# Patient Record
Sex: Female | Born: 1970
Health system: Southern US, Community
[De-identification: ages and names within clinical notes are randomized; demographics above are authoritative.]

## PROBLEM LIST (undated history)

## (undated) DIAGNOSIS — D259 Leiomyoma of uterus, unspecified: Secondary | ICD-10-CM

## (undated) DIAGNOSIS — I10 Essential (primary) hypertension: Secondary | ICD-10-CM

---

## 2000-12-11 ENCOUNTER — Encounter: Payer: Self-pay | Admitting: Family Medicine

## 2000-12-11 ENCOUNTER — Ambulatory Visit (HOSPITAL_COMMUNITY): Admission: RE | Admit: 2000-12-11 | Discharge: 2000-12-11 | Payer: Self-pay | Admitting: Family Medicine

## 2001-03-22 ENCOUNTER — Emergency Department (HOSPITAL_COMMUNITY): Admission: EM | Admit: 2001-03-22 | Discharge: 2001-03-22 | Payer: Self-pay | Admitting: Emergency Medicine

## 2001-04-07 ENCOUNTER — Encounter (HOSPITAL_COMMUNITY): Admission: RE | Admit: 2001-04-07 | Discharge: 2001-05-07 | Payer: Self-pay | Admitting: Pulmonary Disease

## 2001-06-04 ENCOUNTER — Emergency Department (HOSPITAL_COMMUNITY): Admission: EM | Admit: 2001-06-04 | Discharge: 2001-06-04 | Payer: Self-pay | Admitting: Emergency Medicine

## 2002-04-19 ENCOUNTER — Encounter: Payer: Self-pay | Admitting: Obstetrics and Gynecology

## 2002-04-19 ENCOUNTER — Ambulatory Visit (HOSPITAL_COMMUNITY): Admission: RE | Admit: 2002-04-19 | Discharge: 2002-04-19 | Payer: Self-pay | Admitting: Obstetrics and Gynecology

## 2002-05-14 ENCOUNTER — Observation Stay (HOSPITAL_COMMUNITY): Admission: AD | Admit: 2002-05-14 | Discharge: 2002-05-15 | Payer: Self-pay | Admitting: Obstetrics and Gynecology

## 2002-05-19 ENCOUNTER — Inpatient Hospital Stay (HOSPITAL_COMMUNITY): Admission: AD | Admit: 2002-05-19 | Discharge: 2002-05-22 | Payer: Self-pay | Admitting: Obstetrics and Gynecology

## 2002-07-04 ENCOUNTER — Ambulatory Visit (HOSPITAL_COMMUNITY): Admission: RE | Admit: 2002-07-04 | Discharge: 2002-07-04 | Payer: Self-pay | Admitting: Obstetrics and Gynecology

## 2002-07-04 ENCOUNTER — Encounter: Payer: Self-pay | Admitting: Obstetrics and Gynecology

## 2002-09-30 ENCOUNTER — Ambulatory Visit (HOSPITAL_COMMUNITY): Admission: RE | Admit: 2002-09-30 | Discharge: 2002-09-30 | Payer: Self-pay | Admitting: Family Medicine

## 2002-09-30 ENCOUNTER — Encounter: Payer: Self-pay | Admitting: Family Medicine

## 2002-12-22 ENCOUNTER — Other Ambulatory Visit: Admission: RE | Admit: 2002-12-22 | Discharge: 2002-12-22 | Payer: Self-pay | Admitting: Unknown Physician Specialty

## 2006-05-23 ENCOUNTER — Emergency Department (HOSPITAL_COMMUNITY): Admission: EM | Admit: 2006-05-23 | Discharge: 2006-05-23 | Payer: Self-pay | Admitting: Emergency Medicine

## 2007-09-22 ENCOUNTER — Other Ambulatory Visit: Admission: RE | Admit: 2007-09-22 | Discharge: 2007-09-22 | Payer: Self-pay | Admitting: Obstetrics and Gynecology

## 2008-05-18 ENCOUNTER — Ambulatory Visit (HOSPITAL_COMMUNITY): Admission: RE | Admit: 2008-05-18 | Discharge: 2008-05-18 | Payer: Self-pay | Admitting: Internal Medicine

## 2010-06-11 ENCOUNTER — Emergency Department (HOSPITAL_COMMUNITY)
Admission: EM | Admit: 2010-06-11 | Discharge: 2010-06-11 | Payer: Self-pay | Source: Home / Self Care | Admitting: Emergency Medicine

## 2010-11-15 NOTE — Op Note (Signed)
   NAME:  KIORA, HALLBERG                         ACCOUNT NO.:  [id number]   MEDICAL RECORD NO.:  [id number]                   PATIENT TYPE:  INP   LOCATION:  A428                                 FACILITY:  [facility]   PHYSICIAN:  Lazaro Arms, M.D.                DATE OF BIRTH:  [birth year]   DATE OF PROCEDURE:  05/20/2002  DATE OF DISCHARGE:                                 OPERATIVE REPORT   EPIDURAL NOTE:  This patient is a multigravida who is 4-5 cm and is having  regular uterine contractions; had IV pain medicine; requesting an epidural.   The patient was placed in the sitting position.  The iliac crest and the  spinous processes were identified, L2-L3 and L3-L4 interspaces were  identified.  The back was prepped and field draped.  Lidocaine 1% was  injected as a skin wheal. A 17-gauge Touhy needle was used and a loss of  resistance technique employed to find the epidural space.  It required 2  skin punctures.   Ten cc of 0.125% bupivacaine plain was given as a test dose with no adverse  effects.  An additional 10 cc was given again without effect.  The 9 cc  bolus of the continuous mixture was given followed by 12 cc an hour.  The  patient tolerated the procedure well and she experienced no adverse  reactions.  It was placed at the L2-L3 interspace.                                                Lazaro Arms, M.D.    Loraine Maple  D:  05/20/2002  T:  05/21/2002  Job:  [id number]

## 2010-11-15 NOTE — Discharge Summary (Signed)
NAME:  Elizabeth Harper, Elizabeth Harper                         ACCOUNT NO.:  [id number]   MEDICAL RECORD NO.:  [id number]                   PATIENT TYPE:  OBV   LOCATION:  A415                                 FACILITY:  [facility]   PHYSICIAN:  Langley Gauss, M.D.                DATE OF BIRTH:  1971-05-13   DATE OF ADMISSION:  05/14/2002  DATE OF DISCHARGE:  05/15/2002                                 DISCHARGE SUMMARY   DIAGNOSES:  1. Thirty-seven-week intrauterine pregnancy.  2. Threatened preterm labor.  3. Polyhydramnios.   PROCEDURES:  1. Limited OB ultrasound performed by Dr. Roylene Reason. Lisette Grinder.  2. Non-stress test.   Interpretation:  Both of these performed on May 14, 2002.   HOSPITAL COURSE:  This is a 40 year old gravida 4, para 3, at 37-1/[redacted] weeks  gestation who presents to [facility] with the chief complaint of  abdominal pain.  The patient's prenatal course has been complicated by the  following findings of polyhydramnios.  The patient states she had an  ultrasound done in the middle of October which revealed increased amniotic  fluid volume.  The infant was noted to be vertex at that time.  Subsequently, the patient has been seen in the office of [facility]  at which time she was planned to have an induction of labor at [redacted] weeks  gestation.  She is experiencing significant abdominal discomfort and some  shortness of breath with minimal exertion felt to be due to the  polyhydramnios.   OBSTETRICAL HISTORY:  She has three prior vaginal deliveries without  complications.  Most pertinently, normal glucose tolerance test on each of  these.  There is no evidence of gestational diabetes.   ALLERGIES:  No known drug allergies.   PHYSICAL EXAMINATION:  GENERAL:  Obese black female.  VITAL SIGNS:  Height 5 feet 5 inches, weight 245 pounds.  Blood pressure  114/75, pulse rate of 111, temperature 97.4, respiratory rate is 20.  HEENT:  Negative.  No adenopathy.  NECK:   Supple.  Thyroid is nonpalpable.  LUNGS:  Clear.  CARDIOVASCULAR:  Sinus, regular rate and rhythm.  ABDOMEN:  Soft and nontender.  Fundal height is noted at 43 cm.  She appears  to be vertex presentation by [employer].  No surgical scars are  identified.  PELVIC:  Normal external genitalia.  No lesions or ulcerations identified.  Vaginal examination by the nursing staff reveals the cervix to be 1-2 cm  dilated, presenting part is not engaged and not palpable.  External fetal  monitoring reveals fetal heart rate baseline of 140s, contractions occurring  every three to six minutes.  A reassuring fetal heart rate is noted.  Initial assessment by the nursing staff revealed apparent significant  discomfort associated with these uterine contractions felt to be out of  proportion to the strength of the contractions.  Likewise, presentation was  uncertain.  Thus,  M.D. consultation was requested.   HOSPITAL COURSE:  The patient was evaluated on May 14, 2002, at which  time the limited OB ultrasound was performed.  The infant is noted to be  vertex presentation.  The vertex is not engaged in the pelvis, though.  Amniotic fluid index is performed with a -5/20.  The placenta is noted to be  fundal and primarily posterior.  Good fetal movement is identified.  Normal  fetal tone.  Mild fetal cardiac activity is identified as well as fetal  breathing movements.  Non-stress test is noted to be reactive.  The uterine  contractions at the time of my evaluation have diminished in their frequency  and intensity, such that they are every three to seven minutes and of mild  intensity.  Laboratory studies requested reveal many bacteria, however, many  urine epithelial cells likewise identified, 11-20 white cells.  Specific  gravity is greater than 1.030, with ketones of greater than 80.  White count  is 8.7, hemoglobin 10.6, hematocrit 33.6.   HOSPITAL COURSE:  The patient following this  observation period was  evaluated with serial digital examinations of the cervix.  Likewise,  external fetal monitor was watched carefully for possible onset of labor.  The patient, however, had variable complaints of contractions, the  contractions occurring every three to 10 minutes, however, never increasing  their intensity.  The patient was treated with p.o. Tylox for therapeutic  rest as well as p.o. Ambien.  These were administered primarily on May 14, 2002, after which time the patient was noted to sleep well during the  evening.  Thus, on May 15, 2002, it is determined that she is not in  active labor.  Uterus is soft and nontender.  The patient has had no  documented cervical change.  Thus, she is discharged home on May 15, 2002, with the findings of not in labor.  Complete evaluation has now  revealed a reactive non-stress test.  A biophysical profile is 10/10, and a  limited OB ultrasound confirms vertex presentation.   DISCHARGE MEDICATIONS:  Tylox #30 for abdominal discomfort associated with  pregnancy and uterine contractions.   DISPOSITION:  The patient is to be seen in the office of [facility]  this week for continued discussion of plans for induction at the end of this  week.                                                Langley Gauss, M.D.    DC/MEDQ  D:  05/15/2002  T:  05/15/2002  Job:  [id number]   cc:   [facility]

## 2010-11-15 NOTE — Op Note (Signed)
   NAME:  Elizabeth Harper, Elizabeth Harper                         ACCOUNT NO.:  [id number]   MEDICAL RECORD NO.:  [id number]                   PATIENT TYPE:  INP   LOCATION:  A428                                 FACILITY:  [facility]   PHYSICIAN:  Tilda Burrow, M.D.              DATE OF BIRTH:  12/01/1970   DATE OF PROCEDURE:  DATE OF DISCHARGE:                                 OPERATIVE REPORT   DELIVERY NOTE:  The patient was noted to be 7 cm, minus 3 station at  approximately 1555, less than 5 minutes later she was fully dilated with an  urge to push, at + 2 station.  She pushed with the fetus in a right occiput  posterior for approximately 20-25 minutes and the baby rotated on the  perineum and was delivered over the next push.  There was no nuchal cord  noted.   Mouth and nose were suctioned with a bulb syringe.  The body delivered  easily after that.  Time of birth was 1632, weight 7 pounds 0 ounces, Apgars  8 and 9.   Twenty units of Pitocin diluted in 1000 cc of lactated Ringers was then  infused rapidly IV.  The placenta separated spontaneously and was delivered  by a controlled cord traction at 1638.  It was inspected and appears to be  intact with a 3-vessel cord.  The vagina was then inspected and no  lacerations were found.  The epidural catheter was removed with the blue tip  noted to be intact.  Estimated blood loss 300 cc.     Jacklyn Shell, C.N.M.          Tilda Burrow, M.D.    FC/MEDQ  D:  05/20/2002  T:  05/21/2002  Job:  [id number]   cc:   [facility]

## 2010-11-15 NOTE — Discharge Summary (Signed)
   NAME:  Elizabeth Harper, Elizabeth Harper                         ACCOUNT NO.:  [id number]   MEDICAL RECORD NO.:  [id number]                   PATIENT TYPE:  OBV   LOCATION:  A415                                 FACILITY:  [facility]   PHYSICIAN:  Langley Gauss, M.D.                DATE OF BIRTH:  [birth year]   DATE OF ADMISSION:  05/14/2002  DATE OF DISCHARGE:  05/15/2002                                 DISCHARGE SUMMARY   ADDENDUM:   ADDITIONAL DIAGNOSIS:  Probable cystitis:  The patient's urinalysis was  suggestive of possible urinary tract infection.  Likewise, the patient did  complain of some suprapubic tenderness.  Thus, she was treated during the  hospitalization with Macrodantin 100 mg p.o. b.i.d.  At the time of  discharge she is given an additional prescription for Macrodantin 100 mg  p.o. b.i.d. x 7 days.  Urine cultures are current pending.                                               Langley Gauss, M.D.    DC/MEDQ  D:  05/15/2002  T:  05/15/2002  Job:  [id number]   cc:   [facility]

## 2011-03-12 ENCOUNTER — Other Ambulatory Visit (HOSPITAL_COMMUNITY): Payer: Self-pay | Admitting: Family Medicine

## 2011-03-12 DIAGNOSIS — Z139 Encounter for screening, unspecified: Secondary | ICD-10-CM

## 2011-03-20 ENCOUNTER — Ambulatory Visit (HOSPITAL_COMMUNITY)
Admission: RE | Admit: 2011-03-20 | Discharge: 2011-03-20 | Disposition: A | Discharge status: Home / Self Care | Payer: Self-pay | Source: Ambulatory Visit | Attending: Family Medicine | Admitting: Family Medicine

## 2011-03-20 DIAGNOSIS — Z139 Encounter for screening, unspecified: Secondary | ICD-10-CM

## 2011-03-20 DIAGNOSIS — Z1231 Encounter for screening mammogram for malignant neoplasm of breast: Secondary | ICD-10-CM | POA: Insufficient documentation

## 2011-11-18 ENCOUNTER — Encounter (HOSPITAL_COMMUNITY): Payer: Self-pay | Admitting: *Deleted

## 2011-11-18 ENCOUNTER — Emergency Department (HOSPITAL_COMMUNITY)
Admission: EM | Admit: 2011-11-18 | Discharge: 2011-11-18 | Disposition: A | Discharge status: Home / Self Care | Payer: PRIVATE HEALTH INSURANCE | Attending: Emergency Medicine | Admitting: Emergency Medicine

## 2011-11-18 DIAGNOSIS — J329 Chronic sinusitis, unspecified: Secondary | ICD-10-CM | POA: Insufficient documentation

## 2011-11-18 DIAGNOSIS — Z79899 Other long term (current) drug therapy: Secondary | ICD-10-CM | POA: Insufficient documentation

## 2011-11-18 DIAGNOSIS — J069 Acute upper respiratory infection, unspecified: Secondary | ICD-10-CM

## 2011-11-18 HISTORY — DX: Essential (primary) hypertension: I10

## 2011-11-18 MED ORDER — PSEUDOEPHEDRINE HCL 60 MG PO TABS: ORAL_TABLET | ORAL | Status: DC

## 2011-11-18 MED ORDER — PSEUDOEPHEDRINE HCL 60 MG PO TABS
60.0000 mg | ORAL_TABLET | Freq: Once | ORAL | Status: AC
  Administered 2011-11-18: 60 mg via ORAL
  Filled 2011-11-18: qty 1

## 2011-11-18 MED ORDER — PROMETHAZINE-CODEINE 6.25-10 MG/5ML PO SYRP: 5.0000 mL | ORAL_SOLUTION | ORAL | Status: AC | PRN

## 2011-11-18 MED ORDER — AMOXICILLIN 500 MG PO CAPS: ORAL_CAPSULE | ORAL | Status: DC

## 2011-11-18 MED ORDER — ACETAMINOPHEN 325 MG PO TABS
650.0000 mg | ORAL_TABLET | Freq: Once | ORAL | Status: AC
  Administered 2011-11-18: 650 mg via ORAL
  Filled 2011-11-18: qty 2

## 2011-11-18 MED ORDER — IBUPROFEN 800 MG PO TABS
800.0000 mg | ORAL_TABLET | Freq: Once | ORAL | Status: AC
  Administered 2011-11-18: 800 mg via ORAL
  Filled 2011-11-18: qty 1

## 2011-11-18 NOTE — ED Provider Notes (Signed)
History     CSN: [id number]  Arrival date & time 11/18/11  1935   None     Chief Complaint  Patient presents with  . Nasal Congestion  . Chills  . Generalized Body Aches    (Consider location/radiation/quality/duration/timing/severity/associated sxs/prior treatment) HPI Comments: Patient complains of nasal congestion, chills, and generalized body aches that started on may 19. Patient feels she has had temperature elevations, but has not measured her temperature. She has not had diarrhea. She has not had vomiting, but has had some mild nausea. There's been no unusual rash. Appreciated. There's been no hemoptysis. She does cough up some colored phlegm from time to time. She has tried Tylenol and over-the-counter cold imaging, but there's been no success.  The history is provided by the patient.    Past Medical History  Diagnosis Date  . Hypertension     History reviewed. No pertinent past surgical history.  History reviewed. No pertinent family history.  History  Substance Use Topics  . Smoking status: Never Smoker   . Smokeless tobacco: Not on file  . Alcohol Use: No    OB History    Grav Para Term Preterm Abortions TAB SAB Ect Mult Living                  Review of Systems  Constitutional: Negative for activity change.       All ROS Neg except as noted in HPI  HENT: Positive for congestion, sore throat and postnasal drip. Negative for nosebleeds and neck pain.   Eyes: Negative for photophobia and discharge.  Respiratory: Positive for cough. Negative for shortness of breath and wheezing.   Cardiovascular: Negative for chest pain and palpitations.  Gastrointestinal: Positive for nausea. Negative for abdominal pain and blood in stool.  Genitourinary: Negative for dysuria, frequency and hematuria.  Musculoskeletal: Positive for myalgias. Negative for back pain and arthralgias.  Skin: Negative.   Neurological: Negative for dizziness, seizures and speech difficulty.    Psychiatric/Behavioral: Negative for hallucinations and confusion.    Allergies  Review of patient's allergies indicates no known allergies.  Home Medications   Current Outpatient Rx  Name Route Sig Dispense Refill  . HYDROCHLOROTHIAZIDE 25 MG PO TABS Oral Take 25 mg by mouth daily.    . NYQUIL PO Oral Take 30 mLs by mouth at bedtime as needed.    . AMOXICILLIN 500 MG PO CAPS  2 po bid with foof 28 capsule 0  . PROMETHAZINE-CODEINE 6.25-10 MG/5ML PO SYRP Oral Take 5 mLs by mouth every 4 (four) hours as needed for cough. 120 mL 0  . PSEUDOEPHEDRINE HCL 60 MG PO TABS  1 po tid for congestion 30 tablet 0    BP 139/83  Pulse 106  Temp(Src) 99 F (37.2 C) (Oral)  Resp 20  Ht 5\' 6"  (1.676 m)  Wt 272 lb (123.378 kg)  BMI 43.90 kg/m2  SpO2 100%  LMP 11/17/2011  Physical Exam  Nursing note and vitals reviewed. Constitutional: She is oriented to person, place, and time. She appears well-developed and well-nourished.  Non-toxic appearance.  HENT:  Head: Normocephalic.  Right Ear: Tympanic membrane and external ear normal.  Left Ear: Tympanic membrane and external ear normal.       Nasal congestion present. Mild increased redness of the posterior pharynx. Airway is patent. Uvula is in the midline.  Eyes: EOM and lids are normal. Pupils are equal, round, and reactive to light.  Neck: Normal range of motion. Neck supple. Carotid  bruit is not present.  Cardiovascular: Normal rate, regular rhythm, normal heart sounds, intact distal pulses and normal pulses.   Pulmonary/Chest: Breath sounds normal. No respiratory distress.  Abdominal: Soft. Bowel sounds are normal. There is no tenderness. There is no guarding.  Musculoskeletal: Normal range of motion.  Lymphadenopathy:       Head (right side): No submandibular adenopathy present.       Head (left side): No submandibular adenopathy present.    She has no cervical adenopathy.  Neurological: She is alert and oriented to person, place, and  time. She has normal strength. No cranial nerve deficit or sensory deficit.  Skin: Skin is warm and dry.  Psychiatric: She has a normal mood and affect. Her speech is normal.    ED Course  Procedures (including critical care time)  Labs Reviewed - No data to display No results found. Pulse oximetry 100% on room air. Within normal limits by my interpretation.  1. Sinusitis   2. URI (upper respiratory infection)       MDM  I have reviewed nursing notes, vital signs, and all appropriate lab and imaging results for this patient. Vital signs reviewed. Pulse oximetry reviewed. Patient treated with promethazine cough medication every 4 hours. Sudafed 3 times daily for congestion. And amoxicillin 2 times daily with food. Patient is to return if any changes, problems, or concerns.       Kathie Dike, [place] 11/18/11 2057

## 2011-11-18 NOTE — ED Notes (Signed)
C/o body aches, chills and congestion since Sunday, ? fever

## 2011-11-18 NOTE — ED Notes (Signed)
Sick since 5/19, sore throat, cough, nasal congestion and "feels cold"  No NVD. Alert.

## 2011-11-18 NOTE — Discharge Instructions (Signed)
Please increase fluids. Please wash hands frequently. Sudafed 3 times daily for congestion. Amoxicillin 2 times daily with food. Promethazine cough medication every 4 hours as needed. For cough, this medication may cause drowsiness, please use with caution.Sinusitis Sinusitis an infection of the air pockets (sinuses) in your face. This can cause puffiness (swelling). It can also cause drainage from your sinuses.  HOME CARE   Only take medicine as told by your doctor.   Drink enough fluids to keep your pee (urine) clear or pale yellow.   Apply moist heat or ice packs for pain relief.   Use salt (saline) nose sprays. The spray will wet the thick fluid in the nose. This can help the sinuses drain.  GET HELP RIGHT AWAY IF:   You have a fever.   Your baby is older than 3 months with a rectal temperature of 102 F (38.9 C) or higher.   Your baby is 44 months old or younger with a rectal temperature of 100.4 F (38 C) or higher.   The pain gets worse.   You get a very bad headache.   You keep throwing up (vomiting).   Your face gets puffy.  MAKE SURE YOU:   Understand these instructions.   Will watch your condition.   Will get help right away if you are not doing well or get worse.  Document Released: 12/03/2007 Document Revised: 06/05/2011 Document Reviewed: 12/03/2007 [facility] Patient Information 2012 [place], [place].Upper Respiratory Infection, Adult An upper respiratory infection (URI) is also sometimes known as the common cold. The upper respiratory tract includes the nose, sinuses, throat, trachea, and bronchi. Bronchi are the airways leading to the lungs. Most people improve within 1 week, but symptoms can last up to 2 weeks. A residual cough may last even longer.  CAUSES Many different viruses can infect the tissues lining the upper respiratory tract. The tissues become irritated and inflamed and often become very moist. Mucus production is also common. A cold is contagious.  You can easily spread the virus to others by oral contact. This includes kissing, sharing a glass, coughing, or sneezing. Touching your mouth or nose and then touching a surface, which is then touched by another person, can also spread the virus. SYMPTOMS  Symptoms typically develop 1 to 3 days after you come in contact with a cold virus. Symptoms vary from person to person. They may include:  Runny nose.   Sneezing.   Nasal congestion.   Sinus irritation.   Sore throat.   Loss of voice (laryngitis).   Cough.   Fatigue.   Muscle aches.   Loss of appetite.   Headache.   Low-grade fever.  DIAGNOSIS  You might diagnose your own cold based on familiar symptoms, since most people get a cold 2 to 3 times a year. Your caregiver can confirm this based on your exam. Most importantly, your caregiver can check that your symptoms are not due to another disease such as strep throat, sinusitis, pneumonia, asthma, or epiglottitis. Blood tests, throat tests, and X-rays are not necessary to diagnose a common cold, but they may sometimes be helpful in excluding other more serious diseases. Your caregiver will decide if any further tests are required. RISKS AND COMPLICATIONS  You may be at risk for a more severe case of the common cold if you smoke cigarettes, have chronic heart disease (such as heart failure) or lung disease (such as asthma), or if you have a weakened immune system. The very young and very  old are also at risk for more serious infections. Bacterial sinusitis, middle ear infections, and bacterial pneumonia can complicate the common cold. The common cold can worsen asthma and chronic obstructive pulmonary disease (COPD). Sometimes, these complications can require emergency medical care and may be life-threatening. PREVENTION  The best way to protect against getting a cold is to practice good hygiene. Avoid oral or hand contact with people with cold symptoms. Wash your hands often if  contact occurs. There is no clear evidence that vitamin C, vitamin E, echinacea, or exercise reduces the chance of developing a cold. However, it is always recommended to get plenty of rest and practice good nutrition. TREATMENT  Treatment is directed at relieving symptoms. There is no cure. Antibiotics are not effective, because the infection is caused by a virus, not by bacteria. Treatment may include:  Increased fluid intake. Sports drinks offer valuable electrolytes, sugars, and fluids.   Breathing heated mist or steam (vaporizer or shower).   Eating chicken soup or other clear broths, and maintaining good nutrition.   Getting plenty of rest.   Using gargles or lozenges for comfort.   Controlling fevers with ibuprofen or acetaminophen as directed by your caregiver.   Increasing usage of your inhaler if you have asthma.  Zinc gel and zinc lozenges, taken in the first 24 hours of the common cold, can shorten the duration and lessen the severity of symptoms. Pain medicines may help with fever, muscle aches, and throat pain. A variety of non-prescription medicines are available to treat congestion and runny nose. Your caregiver can make recommendations and may suggest nasal or lung inhalers for other symptoms.  HOME CARE INSTRUCTIONS   Only take over-the-counter or prescription medicines for pain, discomfort, or fever as directed by your caregiver.   Use a warm mist humidifier or inhale steam from a shower to increase air moisture. This may keep secretions moist and make it easier to breathe.   Drink enough water and fluids to keep your urine clear or pale yellow.   Rest as needed.   Return to work when your temperature has returned to normal or as your caregiver advises. You may need to stay home longer to avoid infecting others. You can also use a face mask and careful hand washing to prevent spread of the virus.  SEEK MEDICAL CARE IF:   After the first few days, you feel you are  getting worse rather than better.   You need your caregiver's advice about medicines to control symptoms.   You develop chills, worsening shortness of breath, or brown or red sputum. These may be signs of pneumonia.   You develop yellow or brown nasal discharge or pain in the face, especially when you bend forward. These may be signs of sinusitis.   You develop a fever, swollen neck glands, pain with swallowing, or white areas in the back of your throat. These may be signs of strep throat.  SEEK IMMEDIATE MEDICAL CARE IF:   You have a fever.   You develop severe or persistent headache, ear pain, sinus pain, or chest pain.   You develop wheezing, a prolonged cough, cough up blood, or have a change in your usual mucus (if you have chronic lung disease).   You develop sore muscles or a stiff neck.  Document Released: 12/10/2000 Document Revised: 06/05/2011 Document Reviewed: 10/18/2010 [facility] Patient Information 2012 [place], [place].

## 2011-11-19 NOTE — ED Provider Notes (Signed)
Medical screening examination/treatment/procedure(s) were performed by non-physician practitioner and as supervising physician I was immediately available for consultation/collaboration. Devoria Albe, MD, FACEP   Ward Givens, MD 11/19/11 [id number]

## 2012-06-16 ENCOUNTER — Other Ambulatory Visit (HOSPITAL_COMMUNITY): Payer: Self-pay | Admitting: Family Medicine

## 2012-06-16 DIAGNOSIS — Z139 Encounter for screening, unspecified: Secondary | ICD-10-CM

## 2012-06-29 ENCOUNTER — Ambulatory Visit (HOSPITAL_COMMUNITY)
Admission: RE | Admit: 2012-06-29 | Discharge: 2012-06-29 | Disposition: A | Discharge status: Home / Self Care | Payer: BC Managed Care – PPO | Source: Ambulatory Visit | Attending: Family Medicine | Admitting: Family Medicine

## 2012-06-29 DIAGNOSIS — Z139 Encounter for screening, unspecified: Secondary | ICD-10-CM

## 2012-06-29 DIAGNOSIS — Z1231 Encounter for screening mammogram for malignant neoplasm of breast: Secondary | ICD-10-CM | POA: Insufficient documentation

## 2012-07-15 ENCOUNTER — Other Ambulatory Visit (HOSPITAL_COMMUNITY)
Admission: RE | Admit: 2012-07-15 | Discharge: 2012-07-15 | Disposition: A | Discharge status: Home / Self Care | Payer: BC Managed Care – PPO | Source: Ambulatory Visit | Attending: Obstetrics and Gynecology | Admitting: Obstetrics and Gynecology

## 2012-07-15 ENCOUNTER — Other Ambulatory Visit: Payer: Self-pay | Admitting: Adult Health

## 2012-07-15 DIAGNOSIS — Z01419 Encounter for gynecological examination (general) (routine) without abnormal findings: Secondary | ICD-10-CM | POA: Insufficient documentation

## 2012-07-15 DIAGNOSIS — Z1151 Encounter for screening for human papillomavirus (HPV): Secondary | ICD-10-CM | POA: Insufficient documentation

## 2012-07-15 DIAGNOSIS — Z113 Encounter for screening for infections with a predominantly sexual mode of transmission: Secondary | ICD-10-CM | POA: Insufficient documentation

## 2012-07-15 LAB — BASIC METABOLIC PANEL
BUN: 8 mg/dL (ref 4–21)
Creatinine: 0.6 mg/dL (ref 0.5–1.1)
Glucose: 79 mg/dL
Potassium: 3.9 mmol/L (ref 3.4–5.3)
Sodium: 138 mmol/L (ref 137–147)

## 2012-07-15 LAB — LIPID PANEL
Cholesterol: 150 mg/dL (ref 0–200)
HDL: 37 mg/dL (ref 35–70)
LDL Cholesterol: 96 mg/dL
Triglycerides: 86 mg/dL (ref 40–160)

## 2012-07-15 LAB — CBC AND DIFFERENTIAL
HCT: 33 % — AB (ref 36–46)
Hemoglobin: 11 g/dL — AB (ref 12.0–16.0)

## 2012-07-16 ENCOUNTER — Encounter: Payer: Self-pay | Admitting: Family Medicine

## 2012-07-16 ENCOUNTER — Ambulatory Visit (INDEPENDENT_AMBULATORY_CARE_PROVIDER_SITE_OTHER): Payer: BC Managed Care – PPO | Admitting: Family Medicine

## 2012-07-16 VITALS — BP 130/86 | HR 95 | Resp 18 | Ht 66.0 in | Wt 287.1 lb

## 2012-07-16 DIAGNOSIS — E669 Obesity, unspecified: Secondary | ICD-10-CM

## 2012-07-16 DIAGNOSIS — I10 Essential (primary) hypertension: Secondary | ICD-10-CM

## 2012-07-16 MED ORDER — DOXYCYCLINE HYCLATE 100 MG PO TABS: 100.0000 mg | ORAL_TABLET | Freq: Two times a day (BID) | ORAL | Status: DC

## 2012-07-16 MED ORDER — HYDROCHLOROTHIAZIDE 25 MG PO TABS: 25.0000 mg | ORAL_TABLET | Freq: Every day | ORAL | Status: DC

## 2012-07-16 NOTE — Patient Instructions (Addendum)
Continue your current medications I will get labs from [facility]  Medications refilled I recommend 1800calories At least 30 minutes of exercise- start with 5 minutes of walking Calcium Vit D ( 1000mg / 800iu)  F/U 4 months

## 2012-07-19 DIAGNOSIS — I10 Essential (primary) hypertension: Secondary | ICD-10-CM | POA: Insufficient documentation

## 2012-07-19 NOTE — Assessment & Plan Note (Signed)
Blood pressure looks okay today we'll continue her hydrochlorothiazide I will obtain her last set of labs from her GYN

## 2012-07-19 NOTE — Assessment & Plan Note (Addendum)
Discussed importance of weight loss and increased activity She's had calcium and vitamin D Recommended 1500 1800-calorie

## 2012-07-19 NOTE — Progress Notes (Signed)
  Subjective:    Patient ID: Elizabeth Harper, female    DOB: [birth year], 42 y.o.   MRN: [id number]  HPI  Patient here to establish care. Last PCP was counseled family medicine. 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 Pete Glatter in [place] [facility] doctor  Review of Systems   GEN- denies fatigue, fever, weight loss,weakness, recent illness HEENT- denies eye drainage, change in vision, nasal discharge, CVS- denies chest pain, palpitations RESP- denies SOB, cough, wheeze ABD- denies N/V, change in stools, abd pain GU- denies dysuria, hematuria, dribbling, incontinence MSK- denies joint pain, muscle aches, injury Neuro- denies headache, dizziness, syncope, seizure activity      Objective:   Physical Exam GEN- NAD, alert and oriented x3, obese HEENT- PERRL, EOMI, non injected sclera, pink conjunctiva, MMM, oropharynx clear Neck- Supple,  CVS- RRR, no murmur RESP-CTAB ABD-NABS,soft,Mild TTP over pelvic region, no rebound, no guarding EXT- No edema Pulses- Radial, DP- 2+ Psych-normal affect and mood       Assessment & Plan:

## 2012-08-06 ENCOUNTER — Ambulatory Visit: Payer: BC Managed Care – PPO | Admitting: Family Medicine

## 2012-08-09 ENCOUNTER — Ambulatory Visit (INDEPENDENT_AMBULATORY_CARE_PROVIDER_SITE_OTHER): Payer: BC Managed Care – PPO | Admitting: Family Medicine

## 2012-08-09 ENCOUNTER — Encounter: Payer: Self-pay | Admitting: Family Medicine

## 2012-08-09 VITALS — BP 130/82 | HR 96 | Resp 18 | Ht 66.0 in | Wt 288.0 lb

## 2012-08-09 DIAGNOSIS — M25519 Pain in unspecified shoulder: Secondary | ICD-10-CM

## 2012-08-09 DIAGNOSIS — D259 Leiomyoma of uterus, unspecified: Secondary | ICD-10-CM

## 2012-08-09 DIAGNOSIS — R21 Rash and other nonspecific skin eruption: Secondary | ICD-10-CM | POA: Insufficient documentation

## 2012-08-09 NOTE — Assessment & Plan Note (Signed)
I think this is actually due to stress no specific injury no medications needed at this time

## 2012-08-09 NOTE — Progress Notes (Signed)
  Subjective:    Patient ID: Elizabeth Harper, female    DOB: 11/23/1970, 42 y.o.   MRN: [id number]  HPI  Patient presents to discuss recent findings with her GYN. She's been very stressed about that she was told she needed to have a hysterectomy do to her heavy bleeding and fibroid tumors. She said they did an ultrasound in the office when they removed her Mirena, her pelvic pain has improved since then.   She's been very anxious and stressed about having to go through surgery and actually wanted a second opinion before moving forward. She did have blood work done in the only thing that was elevated it was an ESR at 20 I do not have the actual results of this. She's been having bilateral shoulder pain for the past couple of days and she thought it may be related to the elevated sedimentation rate no specific injury She's also noted a few spots on her arm after coming in contact with the patient they were itching initially they are resolving  Review of Systems  GEN- denies fatigue, fever, weight loss,weakness, recent illness HEENT- denies eye drainage, change in vision, nasal discharge, CVS- denies chest pain, palpitations RESP- denies SOB, cough, wheeze ABD- denies N/V, change in stools, abd pain GU- denies dysuria, hematuria, dribbling, incontinence MSK- + joint pain, muscle aches, injury Neuro- denies headache, dizziness, syncope, seizure activity      Objective:   Physical Exam GEN- NAD, alert and oriented x3 Neck- no spasm, normal ROM, supple CVS- RRR, no murmur RESP-CTAB MSK- normal inspection, rotator cuff in tact, normal strength bilat, biceps in tact EXT- No edema Skin- few scattered scab like lesions on forearm, 1 lesion with mild erythema, no papules or pustules Pulses- Radial 2+        Assessment & Plan:

## 2012-08-09 NOTE — Patient Instructions (Signed)
Continue current meds Referral for GYN Keep previous appt 1% hydrocortisone twice a day to arm

## 2012-08-09 NOTE — Assessment & Plan Note (Signed)
Will send her for second opinion with GYN in [place]. I will obtain her records from her current GYN he did the ultrasound and the lab work unfortunately the ultrasound was not done in the [employer] systems therefore I cannot download this information

## 2012-08-09 NOTE — Assessment & Plan Note (Signed)
Appears to be Mild contact dermatitis with a few lesions left, topical corticosteroid

## 2012-08-19 ENCOUNTER — Encounter: Payer: Self-pay | Admitting: Family Medicine

## 2012-08-19 NOTE — Progress Notes (Signed)
Patient ID: Elizabeth Harper, female   DOB: October 28, 1970, 42 y.o.   MRN: [id number] Review labs from patient's gynecologist cholesterol panel looks good her HDL is a little low this can increase with exercise. Renal function and liver function are normal she has a mild anemia which they're planning for possible hysterectomy

## 2012-08-25 ENCOUNTER — Encounter (HOSPITAL_COMMUNITY): Payer: Self-pay | Admitting: *Deleted

## 2012-08-25 ENCOUNTER — Emergency Department (HOSPITAL_COMMUNITY): Payer: BC Managed Care – PPO

## 2012-08-25 ENCOUNTER — Emergency Department (HOSPITAL_COMMUNITY)
Admission: EM | Admit: 2012-08-25 | Discharge: 2012-08-25 | Disposition: A | Discharge status: Home / Self Care | Payer: BC Managed Care – PPO | Attending: Emergency Medicine | Admitting: Emergency Medicine

## 2012-08-25 DIAGNOSIS — R209 Unspecified disturbances of skin sensation: Secondary | ICD-10-CM | POA: Insufficient documentation

## 2012-08-25 DIAGNOSIS — Z79899 Other long term (current) drug therapy: Secondary | ICD-10-CM | POA: Insufficient documentation

## 2012-08-25 DIAGNOSIS — M5030 Other cervical disc degeneration, unspecified cervical region: Secondary | ICD-10-CM | POA: Insufficient documentation

## 2012-08-25 DIAGNOSIS — R079 Chest pain, unspecified: Secondary | ICD-10-CM | POA: Insufficient documentation

## 2012-08-25 DIAGNOSIS — M47812 Spondylosis without myelopathy or radiculopathy, cervical region: Secondary | ICD-10-CM | POA: Insufficient documentation

## 2012-08-25 DIAGNOSIS — IMO0001 Reserved for inherently not codable concepts without codable children: Secondary | ICD-10-CM | POA: Insufficient documentation

## 2012-08-25 DIAGNOSIS — I10 Essential (primary) hypertension: Secondary | ICD-10-CM | POA: Insufficient documentation

## 2012-08-25 LAB — BASIC METABOLIC PANEL
BUN: 14 mg/dL (ref 6–23)
CO2: 27 mEq/L (ref 19–32)
Calcium: 9.6 mg/dL (ref 8.4–10.5)
Chloride: 100 mEq/L (ref 96–112)
Creatinine, Ser: 0.68 mg/dL (ref 0.50–1.10)
GFR calc Af Amer: >90 mL/min (ref >90)
GFR calc non Af Amer: >90 mL/min (ref >90)
Glucose, Bld: 100 mg/dL — ABNORMAL HIGH (ref 70–99)
Potassium: 3.6 mEq/L (ref 3.5–5.1)
Sodium: 136 mEq/L (ref 135–145)

## 2012-08-25 LAB — TROPONIN I: Troponin I: <0.3 ng/mL (ref <0.30)

## 2012-08-25 MED ORDER — HYDROCODONE-ACETAMINOPHEN 7.5-325 MG PO TABS: ORAL_TABLET | ORAL | Status: DC

## 2012-08-25 MED ORDER — MELOXICAM 7.5 MG PO TABS: ORAL_TABLET | ORAL | Status: DC

## 2012-08-25 NOTE — ED Provider Notes (Signed)
  Medical screening examination/treatment/procedure(s) were performed by non-physician practitioner and as supervising physician I was immediately available for consultation/collaboration.    Vida Roller, MD 08/25/12 [phone]

## 2012-08-25 NOTE — ED Notes (Signed)
RN at bedside

## 2012-08-25 NOTE — ED Notes (Signed)
Patient is resting comfortably. 

## 2012-08-25 NOTE — ED Provider Notes (Signed)
History     CSN: [id number]  Arrival date & time 08/25/12  0808   First MD Initiated Contact with Patient 08/25/12 [phone]      Chief Complaint  Patient presents with  . Shoulder Pain    (Consider location/radiation/quality/duration/timing/severity/associated sxs/prior treatment) Patient is a 42 y.o. female presenting with shoulder pain. The history is provided by the patient.  Shoulder Pain This is a new problem. The current episode started in the past 7 days. The problem occurs intermittently. The problem has been gradually worsening. Associated symptoms include chest pain, myalgias and numbness. Pertinent negatives include no abdominal pain, arthralgias, chills, coughing, fever, nausea, neck pain or vomiting. Exacerbated by: supine position. She has tried nothing for the symptoms. The treatment provided no relief.    Past Medical History  Diagnosis Date  . Hypertension     History reviewed. No pertinent past surgical history.  Family History  Problem Relation Age of Onset  . Hyperlipidemia Mother   . Hyperlipidemia Father   . Diabetes Father   . Hyperlipidemia Brother   . Cancer Maternal Aunt     Breast cancer  . Cancer Paternal Aunt     Breast Cancer    History  Substance Use Topics  . Smoking status: Never Smoker   . Smokeless tobacco: Not on file  . Alcohol Use: No    OB History   Grav Para Term Preterm Abortions TAB SAB Ect Mult Living                  Review of Systems  Constitutional: Negative for fever, chills and activity change.       All ROS Neg except as noted in HPI  HENT: Negative for nosebleeds and neck pain.   Eyes: Negative for photophobia and discharge.  Respiratory: Negative for cough, shortness of breath and wheezing.   Cardiovascular: Positive for chest pain. Negative for palpitations.  Gastrointestinal: Negative for nausea, vomiting, abdominal pain and blood in stool.  Genitourinary: Negative for dysuria, frequency and hematuria.   Musculoskeletal: Positive for myalgias. Negative for back pain and arthralgias.  Skin: Negative.   Neurological: Positive for numbness. Negative for dizziness, seizures and speech difficulty.  Psychiatric/Behavioral: Negative for hallucinations and confusion.    Allergies  Review of patient's allergies indicates no known allergies.  Home Medications   Current Outpatient Rx  Name  Route  Sig  Dispense  Refill  . hydrochlorothiazide (HYDRODIURIL) 25 MG tablet   Oral   Take 1 tablet (25 mg total) by mouth daily.   90 tablet   1     BP 118/77  Temp(Src) 97.7 F (36.5 C) (Oral)  Resp 16  Ht 5\' 6"  (1.676 m)  Wt 288 lb (130.636 kg)  BMI 46.51 kg/m2  SpO2 100%  LMP 08/21/2012  Physical Exam  Nursing note and vitals reviewed. Constitutional: She is oriented to person, place, and time. She appears well-developed and well-nourished.  Non-toxic appearance.  HENT:  Head: Normocephalic.  Right Ear: Tympanic membrane and external ear normal.  Left Ear: Tympanic membrane and external ear normal.  Eyes: EOM and lids are normal. Pupils are equal, round, and reactive to light.  Neck: Normal range of motion. Neck supple. Carotid bruit is not present.  There is muscle tightness and tenseness of the lower neck area extending into the shoulders. There is no palpable step off.  Cardiovascular: Normal rate, regular rhythm, normal heart sounds, intact distal pulses and normal pulses.   Pulmonary/Chest: Breath sounds normal. No  respiratory distress.  Abdominal: Soft. Bowel sounds are normal. There is no tenderness. There is no guarding.  Musculoskeletal: Normal range of motion.  There is muscle tightness and tenseness involving the right and left upper shoulders (trapezius) area. There is full range of motion of the upper extremities.  Lymphadenopathy:       Head (right side): No submandibular adenopathy present.       Head (left side): No submandibular adenopathy present.    She has no  cervical adenopathy.  Neurological: She is alert and oriented to person, place, and time. She has normal strength. No cranial nerve deficit or sensory deficit.  There no motor or sensory deficits of the upper extremities. The grip is symmetrical.  There no motor or sensory deficits of the lower extremities.   Skin: Skin is warm and dry.  Psychiatric: She has a normal mood and affect. Her speech is normal.    ED Course  Procedures (including critical care time)  Labs Reviewed - No data to display No results found. EKG: normal EKG, normal sinus rhythm, there are no previous tracings available for comparison. Rate 90. PR normal. QRS normal. Axis normal. ST normal. No stemi.  No diagnosis found.    MDM  I have reviewed nursing notes, vital signs, and all appropriate lab and imaging results for this patient. The electrocardiogram shows a normal sinus rhythm at 90 beats per minute. The basic metabolic panel is well within normal limits. The troponin is negative at less than 0.30. CT scan of the cervical spine reveals some straightening of the normal lordotic curve. There is no fracture appreciated. No subluxation. There is noted endplate degenerative changes involving C4-C5, C5-C6, and C6-C7. There is a slight loss of disc space height at C5-C6, and C6-C7.  The findings have been discussed with the patient in terms which he understands. Patient advised to see the orthopedic physician for additional evaluation and management. The plan additionally at this time is for the patient to receive a prescription for Mobic 7.5 mg 2 times daily with food. Norco at bedtime if needed for pain, or every 4 hours if needed for more severe pain. Patient is invited to return to the emergency department if any changes, problems, or concerns.       Kathie Dike, [place] 08/25/12 1042

## 2012-08-25 NOTE — ED Notes (Signed)
bil shoulder pain radiating down bil arms and across upper chest intermittently x 1 wk.  Denies weakness, sob, n/v/dizziness.  States worse when lying flat.

## 2012-09-10 ENCOUNTER — Other Ambulatory Visit: Payer: Self-pay | Admitting: Gynecology

## 2012-09-10 DIAGNOSIS — N920 Excessive and frequent menstruation with regular cycle: Secondary | ICD-10-CM

## 2012-09-10 DIAGNOSIS — D259 Leiomyoma of uterus, unspecified: Secondary | ICD-10-CM

## 2012-09-15 ENCOUNTER — Ambulatory Visit
Admission: RE | Admit: 2012-09-15 | Discharge: 2012-09-15 | Disposition: A | Discharge status: Home / Self Care | Payer: BC Managed Care – PPO | Source: Ambulatory Visit | Attending: Gynecology | Admitting: Gynecology

## 2012-09-15 DIAGNOSIS — N920 Excessive and frequent menstruation with regular cycle: Secondary | ICD-10-CM

## 2012-09-15 DIAGNOSIS — D259 Leiomyoma of uterus, unspecified: Secondary | ICD-10-CM

## 2012-09-15 NOTE — Progress Notes (Signed)
Patient ID: Elizabeth Harper, female   DOB: 1970/08/06, 42 y.o.   MRN: [id number] Patient is a very nice 42 year old female with known fibroids by ultrasound and complains of heavy menstrual bleeding.  The patient has had an IUD for many years but it was removed in December 2013 due to abdominal pain.  While she had the IUD, she was not having menstrual periods.  She had a large amount of menstrual bleeding immediately following removal of the IUD.  She says that her menstrual periods are irregular but does feel like the bleeding has slightly decreased in the recent months.  When she does have a menstrual period, it is lasting approximately 7 days with 4 days of heavy flow.  She is also complaining of urinary frequency, urinary urgency and some back pain with rectal discomfort.  Pregnancy history is G5, P4.  She is not taking any hormonal therapy at this time. Her pregnancies were vaginal deliveries.  Past Medical History:  Significant for high blood pressure.  Past Surgical History:  None.  Medications:  Hydrochlorothiazide 25 mg daily, over-the-counter multivitamin with iron.  Allergies:  She has some sensitivity and rash with latex.  No known drug allergies.  Social History:  She is married with four children.  She is a Engineer, civil (consulting) with [employer].  She does not smoke or use alcohol.  She does use caffeinated drinks, approximately 3 per week.  Review of Systems:  Good health except for obesity. She occasionally has some blurred vision of unknown etiology.  She denies gastrointestinal problems.  Cardiovascular is only significant for high blood pressure.  She has some back pain at times.  She denies neurologic problems.  No pulmonary problems.  No psychiatric problems.  Physical Examination:  Vital signs:  Temperature 97.9, pulse 90, respirations 14, O2 saturations 99% on room air.  She is 5'5" tall and weighs 293 pounds.  Calculated BMI is 48.9.  She is a morbidly obese female in no acute distress.  Chest:   Lungs are clear to auscultation bilaterally.  Heart: Regular rate and rhythm.  Abdomen:  The patient is tender to deep palpation in the pelvis.  The groin pulses are not easily palpated.  Lower Page 2 Re:  Crystie Yanko (DOB: Apr 30, 2071)  extremities:  No evidence for edema or discoloration.  The pedal pulses are difficult to palpate but she has dopplerable pulses in the dorsalis pedis and posterior tibial arteries bilaterally.  Imaging:  Ultrasound from [facility] dated 07/22/12 demonstrates a uterus that measures 11.8 x 8.6 x 7.2 cm.  There are multiple fibroids.  The largest fibroid is pedunculated and measures 6.3 cm along the left side.  No evidence for adnexal mass.    Labs:  Pap smear from 07/15/12 was negative for intraepithelial lesions or malignancies.  Labs on 08/25/12 demonstrate a BUN of 14 and creatinine of 0.68.  Assessment:  42 year old morbidly obese female with uterine fibroids, menorrhagia and dysmenorrhea.  These symptoms started after she had her IUD removed in December of 2013.  The symptoms appear to be very severe following removal of the IUD but she says the bleeding and symptoms have gotten a little bit better recently.  We discussed different options for fibroid management, including hysterectomy, myomectomy, hormonal therapy and uterine artery embolization.  We discussed the uterine artery embolization procedure in depth.  She has a good understanding of the risks and benefits.  I explained the recovery period which includes a night in the hospital and likely  missing at least one week from work.  The patient would like to be considered for the uterine artery embolization.  We will schedule the patient for an MRI of the pelvis to further evaluate the fibroids and see if she is a candidate.  In addition, we discussed waiting a month or two to see if the menorrhagia and dysmenorrhea continue.  If the patient remains symptomatic, I feel that she would be a good candidate for  uterine artery embolization procedure as long as there are no contraindications with the MRI.

## 2012-09-16 ENCOUNTER — Telehealth: Payer: Self-pay

## 2012-09-20 MED ORDER — PHENTERMINE HCL 37.5 MG PO TABS: 37.5000 mg | ORAL_TABLET | Freq: Every day | ORAL | Status: DC

## 2012-09-20 NOTE — Telephone Encounter (Signed)
Med faxed to pharmacy

## 2012-09-20 NOTE — Telephone Encounter (Signed)
Please give instruction take 1/2 tablet daily for 2 weeks then increase to 1 tab daily 1500 Calorie diet as we discussed At least 30 minutes of exercise 5 days a week F/U 2 months in office

## 2012-09-23 ENCOUNTER — Ambulatory Visit
Admission: RE | Admit: 2012-09-23 | Discharge: 2012-09-23 | Disposition: A | Discharge status: Home / Self Care | Payer: BC Managed Care – PPO | Source: Ambulatory Visit | Attending: Gynecology | Admitting: Gynecology

## 2012-09-23 DIAGNOSIS — N920 Excessive and frequent menstruation with regular cycle: Secondary | ICD-10-CM

## 2012-09-23 DIAGNOSIS — D259 Leiomyoma of uterus, unspecified: Secondary | ICD-10-CM

## 2012-09-23 MED ORDER — GADOBENATE DIMEGLUMINE 529 MG/ML IV SOLN
20.0000 mL | Freq: Once | INTRAVENOUS | Status: AC | PRN
  Administered 2012-09-23: 20 mL via INTRAVENOUS

## 2012-09-27 ENCOUNTER — Telehealth: Payer: Self-pay | Admitting: Emergency Medicine

## 2012-09-28 ENCOUNTER — Telehealth: Payer: Self-pay | Admitting: Emergency Medicine

## 2012-09-28 NOTE — Telephone Encounter (Signed)
DONE

## 2012-09-28 NOTE — Telephone Encounter (Signed)
CALLED PT TO LET HER KNOW THAT DR HENN REVIEWED THE MRI AND LOOKS GOOD FOR THE [place].  ALSO MENTIONED TO PT THAT DR HENN WILL BE HAPPY TO SPEAK WITH HER IF NEEDED.    PT WILL CALL WHEN READY TO SCHEDULE PROCEDURE.

## 2012-09-30 ENCOUNTER — Other Ambulatory Visit (HOSPITAL_COMMUNITY): Payer: Self-pay | Admitting: Diagnostic Radiology

## 2012-09-30 DIAGNOSIS — D259 Leiomyoma of uterus, unspecified: Secondary | ICD-10-CM

## 2012-11-03 ENCOUNTER — Ambulatory Visit
Admission: RE | Admit: 2012-11-03 | Discharge: 2012-11-03 | Disposition: A | Discharge status: Home / Self Care | Payer: BC Managed Care – PPO | Source: Ambulatory Visit | Attending: Diagnostic Radiology | Admitting: Diagnostic Radiology

## 2012-11-03 DIAGNOSIS — D259 Leiomyoma of uterus, unspecified: Secondary | ICD-10-CM

## 2012-11-19 ENCOUNTER — Ambulatory Visit (INDEPENDENT_AMBULATORY_CARE_PROVIDER_SITE_OTHER): Payer: BC Managed Care – PPO | Admitting: Family Medicine

## 2012-11-19 ENCOUNTER — Encounter: Payer: Self-pay | Admitting: Family Medicine

## 2012-11-19 VITALS — BP 130/82 | HR 98 | Resp 18 | Ht 66.0 in | Wt 279.1 lb

## 2012-11-19 DIAGNOSIS — R7309 Other abnormal glucose: Secondary | ICD-10-CM

## 2012-11-19 DIAGNOSIS — E559 Vitamin D deficiency, unspecified: Secondary | ICD-10-CM

## 2012-11-19 DIAGNOSIS — Z23 Encounter for immunization: Secondary | ICD-10-CM

## 2012-11-19 DIAGNOSIS — E669 Obesity, unspecified: Secondary | ICD-10-CM

## 2012-11-19 DIAGNOSIS — I10 Essential (primary) hypertension: Secondary | ICD-10-CM

## 2012-11-19 DIAGNOSIS — E785 Hyperlipidemia, unspecified: Secondary | ICD-10-CM

## 2012-11-19 DIAGNOSIS — R739 Hyperglycemia, unspecified: Secondary | ICD-10-CM

## 2012-11-19 MED ORDER — PHENTERMINE HCL 37.5 MG PO TABS: 37.5000 mg | ORAL_TABLET | Freq: Every day | ORAL | Status: DC

## 2012-11-19 MED ORDER — HYDROCHLOROTHIAZIDE 25 MG PO TABS: 25.0000 mg | ORAL_TABLET | Freq: Every day | ORAL | Status: DC

## 2012-11-19 NOTE — Assessment & Plan Note (Signed)
Check Vit D stores

## 2012-11-19 NOTE — Assessment & Plan Note (Signed)
She has lost 15 pounds with phentermine in past 2 months, next goal is to increase aerobic exercise Will continue on phentermine  Check fasting labs

## 2012-11-19 NOTE — Patient Instructions (Addendum)
Continue current medications Get the labs done fasting next week Tetanus booster given  (TDAP) Continue to work out  F/U 3 months [employer]

## 2012-11-19 NOTE — Assessment & Plan Note (Signed)
Well controlled, fasting labs

## 2012-11-19 NOTE — Progress Notes (Signed)
  Subjective:    Patient ID: Elizabeth Harper, female    DOB: [birth year], 42 y.o.   MRN: [id number]  HPI  Here to followup chronic medical problems. She's doing well her blood pressure pill. She is due to have an embolization for her fibroid uterine disease. She's very anxious about the procedure. Should also like her labs repeated today she was asking if there were any cancer labs that she needed. She is up-to-date on her mammogram and her Pap smear by her GYN.  She's been taking the phentermine without any problems and trying to increase her activity  Review of Systems    GEN- denies fatigue, fever, weight loss,weakness, recent illness HEENT- denies eye drainage, change in vision, nasal discharge, CVS- denies chest pain, palpitations RESP- denies SOB, cough, wheeze ABD- denies N/V, change in stools, abd pain GU- denies dysuria, hematuria, dribbling, incontinence MSK- denies joint pain, muscle aches, injury Neuro- denies headache, dizziness, syncope, seizure activity       Objective:   Physical Exam GEN- NAD, alert and oriented x3.morbidly obese HEENT- PERRL, EOMI, non injected sclera, pink conjunctiva, MMM, oropharynx clear CVS- RRR, no murmur RESP-CTAB EXT- No edema Pulses- Radial, DP- 2+        Assessment & Plan:

## 2012-12-01 ENCOUNTER — Other Ambulatory Visit (HOSPITAL_COMMUNITY): Payer: Self-pay | Admitting: Diagnostic Radiology

## 2012-12-01 DIAGNOSIS — N920 Excessive and frequent menstruation with regular cycle: Secondary | ICD-10-CM

## 2012-12-22 ENCOUNTER — Other Ambulatory Visit: Payer: Self-pay | Admitting: Radiology

## 2012-12-22 ENCOUNTER — Encounter (HOSPITAL_COMMUNITY): Payer: Self-pay | Admitting: Pharmacy Technician

## 2012-12-24 LAB — LIPID PANEL
Cholesterol: 175 mg/dL (ref 0–200)
HDL: 45 mg/dL (ref >39)
LDL Cholesterol: 111 mg/dL — ABNORMAL HIGH (ref 0–99)
Total CHOL/HDL Ratio: 3.9 Ratio
Triglycerides: 96 mg/dL (ref <150)
VLDL: 19 mg/dL (ref 0–40)

## 2012-12-24 LAB — COMPREHENSIVE METABOLIC PANEL
ALT: 16 U/L (ref 0–35)
AST: 11 U/L (ref 0–37)
Albumin: 3.6 g/dL (ref 3.5–5.2)
Alkaline Phosphatase: 82 U/L (ref 39–117)
BUN: 11 mg/dL (ref 6–23)
CO2: 24 mEq/L (ref 19–32)
Calcium: 8.9 mg/dL (ref 8.4–10.5)
Chloride: 103 mEq/L (ref 96–112)
Creat: 0.69 mg/dL (ref 0.50–1.10)
Glucose, Bld: 96 mg/dL (ref 70–99)
Potassium: 3.6 mEq/L (ref 3.5–5.3)
Sodium: 136 mEq/L (ref 135–145)
Total Bilirubin: 0.3 mg/dL (ref 0.3–1.2)
Total Protein: 6.6 g/dL (ref 6.0–8.3)

## 2012-12-24 LAB — CBC
HCT: 27 % — ABNORMAL LOW (ref 36.0–46.0)
Hemoglobin: 8.4 g/dL — ABNORMAL LOW (ref 12.0–15.0)
MCH: 18.1 pg — ABNORMAL LOW (ref 26.0–34.0)
MCHC: 31.1 g/dL (ref 30.0–36.0)
MCV: 58.2 fL — ABNORMAL LOW (ref 78.0–100.0)
Platelets: 434 10*3/uL — ABNORMAL HIGH (ref 150–400)
RBC: 4.64 MIL/uL (ref 3.87–5.11)
RDW: 17.7 % — ABNORMAL HIGH (ref 11.5–15.5)
WBC: 7.6 10*3/uL (ref 4.0–10.5)

## 2012-12-24 LAB — HEMOGLOBIN A1C
Hgb A1c MFr Bld: 5.7 % — ABNORMAL HIGH (ref <5.7)
Mean Plasma Glucose: 117 mg/dL — ABNORMAL HIGH (ref <117)

## 2012-12-24 LAB — TSH: TSH: 2.052 u[IU]/mL (ref 0.350–4.500)

## 2012-12-27 ENCOUNTER — Encounter (HOSPITAL_COMMUNITY): Payer: Self-pay

## 2012-12-27 ENCOUNTER — Other Ambulatory Visit (HOSPITAL_COMMUNITY): Payer: Self-pay | Admitting: Diagnostic Radiology

## 2012-12-27 ENCOUNTER — Ambulatory Visit (HOSPITAL_COMMUNITY)
Admission: RE | Admit: 2012-12-27 | Discharge: 2012-12-27 | Disposition: A | Discharge status: Home / Self Care | Payer: BC Managed Care – PPO | Source: Ambulatory Visit | Attending: Diagnostic Radiology | Admitting: Diagnostic Radiology

## 2012-12-27 ENCOUNTER — Observation Stay (HOSPITAL_COMMUNITY)
Admission: AD | Admit: 2012-12-27 | Discharge: 2012-12-28 | Disposition: A | Discharge status: Home / Self Care | Payer: BC Managed Care – PPO | Source: Ambulatory Visit | Attending: Diagnostic Radiology | Admitting: Diagnostic Radiology

## 2012-12-27 DIAGNOSIS — N920 Excessive and frequent menstruation with regular cycle: Secondary | ICD-10-CM

## 2012-12-27 DIAGNOSIS — D259 Leiomyoma of uterus, unspecified: Principal | ICD-10-CM | POA: Insufficient documentation

## 2012-12-27 DIAGNOSIS — R110 Nausea: Secondary | ICD-10-CM | POA: Insufficient documentation

## 2012-12-27 DIAGNOSIS — I10 Essential (primary) hypertension: Secondary | ICD-10-CM | POA: Insufficient documentation

## 2012-12-27 DIAGNOSIS — Z79899 Other long term (current) drug therapy: Secondary | ICD-10-CM | POA: Insufficient documentation

## 2012-12-27 DIAGNOSIS — R42 Dizziness and giddiness: Secondary | ICD-10-CM | POA: Insufficient documentation

## 2012-12-27 HISTORY — DX: Leiomyoma of uterus, unspecified: D25.9

## 2012-12-27 HISTORY — PX: UTERINE ARTERY EMBOLIZATION: SHX2629

## 2012-12-27 LAB — BASIC METABOLIC PANEL
BUN: 11 mg/dL (ref 6–23)
CO2: 25 mEq/L (ref 19–32)
Calcium: 8.9 mg/dL (ref 8.4–10.5)
Chloride: 102 mEq/L (ref 96–112)
Creatinine, Ser: 0.69 mg/dL (ref 0.50–1.10)
GFR calc Af Amer: >90 mL/min (ref >90)
GFR calc non Af Amer: >90 mL/min (ref >90)
Glucose, Bld: 101 mg/dL — ABNORMAL HIGH (ref 70–99)
Potassium: 3.5 mEq/L (ref 3.5–5.1)
Sodium: 136 mEq/L (ref 135–145)

## 2012-12-27 LAB — PROTIME-INR
INR: 0.99 (ref 0.00–1.49)
Prothrombin Time: 12.9 seconds (ref 11.6–15.2)

## 2012-12-27 LAB — CBC
HCT: 26.4 % — ABNORMAL LOW (ref 36.0–46.0)
Hemoglobin: 7.9 g/dL — ABNORMAL LOW (ref 12.0–15.0)
MCH: 18.1 pg — ABNORMAL LOW (ref 26.0–34.0)
MCHC: 29.9 g/dL — ABNORMAL LOW (ref 30.0–36.0)
MCV: 60.4 fL — ABNORMAL LOW (ref 78.0–100.0)
Platelets: 365 10*3/uL (ref 150–400)
RBC: 4.37 MIL/uL (ref 3.87–5.11)
RDW: 17 % — ABNORMAL HIGH (ref 11.5–15.5)
WBC: 8.2 10*3/uL (ref 4.0–10.5)

## 2012-12-27 LAB — APTT: aPTT: 27 seconds (ref 24–37)

## 2012-12-27 LAB — HCG, SERUM, QUALITATIVE: Preg, Serum: NEGATIVE

## 2012-12-27 MED ORDER — KETOROLAC TROMETHAMINE 30 MG/ML IJ SOLN
INTRAMUSCULAR | Status: AC
  Filled 2012-12-27: qty 1

## 2012-12-27 MED ORDER — HYDROMORPHONE HCL PF 1 MG/ML IJ SOLN
INTRAMUSCULAR | Status: AC | PRN
  Administered 2012-12-27: 2 mg via INTRAVENOUS

## 2012-12-27 MED ORDER — MIDAZOLAM HCL 2 MG/2ML IJ SOLN
INTRAMUSCULAR | Status: AC | PRN
  Administered 2012-12-27 (×2): 2 mg via INTRAVENOUS

## 2012-12-27 MED ORDER — DOCUSATE SODIUM 100 MG PO CAPS
100.0000 mg | ORAL_CAPSULE | Freq: Two times a day (BID) | ORAL | Status: DC
  Administered 2012-12-27 – 2012-12-28 (×2): 100 mg via ORAL
  Filled 2012-12-27 (×3): qty 1

## 2012-12-27 MED ORDER — CEFAZOLIN SODIUM 1-5 GM-% IV SOLN
INTRAVENOUS | Status: AC
  Administered 2012-12-27: 1000 mg
  Filled 2012-12-27: qty 50

## 2012-12-27 MED ORDER — MIDAZOLAM HCL 2 MG/2ML IJ SOLN
INTRAMUSCULAR | Status: AC
  Filled 2012-12-27: qty 8

## 2012-12-27 MED ORDER — DIPHENHYDRAMINE HCL 50 MG/ML IJ SOLN: 12.5000 mg | Freq: Four times a day (QID) | INTRAMUSCULAR | Status: DC | PRN

## 2012-12-27 MED ORDER — ONDANSETRON HCL 4 MG/2ML IJ SOLN: 4.0000 mg | Freq: Four times a day (QID) | INTRAMUSCULAR | Status: DC | PRN

## 2012-12-27 MED ORDER — KETOROLAC TROMETHAMINE 30 MG/ML IJ SOLN
30.0000 mg | Freq: Once | INTRAMUSCULAR | Status: AC
  Administered 2012-12-27: 30 mg via INTRAVENOUS

## 2012-12-27 MED ORDER — FENTANYL CITRATE 0.05 MG/ML IJ SOLN
INTRAMUSCULAR | Status: AC | PRN
  Administered 2012-12-27 (×2): 100 ug via INTRAVENOUS

## 2012-12-27 MED ORDER — DIPHENHYDRAMINE HCL 12.5 MG/5ML PO ELIX
12.5000 mg | ORAL_SOLUTION | Freq: Four times a day (QID) | ORAL | Status: DC | PRN
  Filled 2012-12-27: qty 5

## 2012-12-27 MED ORDER — FENTANYL CITRATE 0.05 MG/ML IJ SOLN
INTRAMUSCULAR | Status: AC
  Filled 2012-12-27: qty 8

## 2012-12-27 MED ORDER — SODIUM CHLORIDE 0.9 % IJ SOLN: 9.0000 mL | INTRAMUSCULAR | Status: DC | PRN

## 2012-12-27 MED ORDER — SODIUM CHLORIDE 0.9 % IJ SOLN: 3.0000 mL | Freq: Two times a day (BID) | INTRAMUSCULAR | Status: DC

## 2012-12-27 MED ORDER — IOHEXOL 300 MG/ML  SOLN
80.0000 mL | Freq: Once | INTRAMUSCULAR | Status: AC | PRN
  Administered 2012-12-27: 80 mL via INTRA_ARTERIAL

## 2012-12-27 MED ORDER — SODIUM CHLORIDE 0.9 % IV SOLN: Freq: Once | INTRAVENOUS | Status: DC

## 2012-12-27 MED ORDER — HYDROMORPHONE HCL PF 2 MG/ML IJ SOLN
INTRAMUSCULAR | Status: AC
  Filled 2012-12-27: qty 1

## 2012-12-27 MED ORDER — SODIUM CHLORIDE 0.9 % IV SOLN
Freq: Once | INTRAVENOUS | Status: AC
  Administered 2012-12-27: 08:00:00 via INTRAVENOUS

## 2012-12-27 MED ORDER — LIDOCAINE HCL 1 % IJ SOLN
INTRAMUSCULAR | Status: AC
  Filled 2012-12-27: qty 20

## 2012-12-27 MED ORDER — CEFAZOLIN SODIUM-DEXTROSE 2-3 GM-% IV SOLR
INTRAVENOUS | Status: AC
  Administered 2012-12-27: 2000 mg
  Filled 2012-12-27: qty 50

## 2012-12-27 MED ORDER — SODIUM CHLORIDE 0.9 % IJ SOLN: 3.0000 mL | INTRAMUSCULAR | Status: DC | PRN

## 2012-12-27 MED ORDER — HYDROMORPHONE 0.3 MG/ML IV SOLN
INTRAVENOUS | Status: DC
  Administered 2012-12-27: 2.1 mg via INTRAVENOUS
  Administered 2012-12-27: 2.4 mg via INTRAVENOUS
  Administered 2012-12-27: 12:00:00 via INTRAVENOUS
  Administered 2012-12-28: 0.6 mg via INTRAVENOUS
  Filled 2012-12-27: qty 25

## 2012-12-27 MED ORDER — SODIUM CHLORIDE 0.9 % IV SOLN: 250.0000 mL | INTRAVENOUS | Status: DC | PRN

## 2012-12-27 MED ORDER — ONDANSETRON HCL 4 MG/2ML IJ SOLN
INTRAMUSCULAR | Status: AC
  Administered 2012-12-27: 4 mg via INTRAVENOUS
  Filled 2012-12-27: qty 2

## 2012-12-27 MED ORDER — DEXTROSE 5 % IV SOLN
3.0000 g | Freq: Once | INTRAVENOUS | Status: DC
  Filled 2012-12-27: qty 3000

## 2012-12-27 MED ORDER — NALOXONE HCL 0.4 MG/ML IJ SOLN: 0.4000 mg | INTRAMUSCULAR | Status: DC | PRN

## 2012-12-27 NOTE — H&P (Signed)
Chief Complaint: "I'm here for my fibroid treatment" Referring Physician:[place] HPI: Elizabeth Harper is an 42 y.o. female with symptomatic uterine fibroids. She has had MRI and has seen Dr. Lowella Dandy in consult. After discussion of treatment options, she is scheduled for uterine artery embolization. She has had some recent heavy menses earlier this month, but otherwise feels ok. No recent fevers, illness. PMHx and meds reviewed.  Past Medical History:  Past Medical History  Diagnosis Date  . Hypertension     Past Surgical History: History reviewed. No pertinent past surgical history.  Family History:  Family History  Problem Relation Age of Onset  . Hyperlipidemia Mother   . Hyperlipidemia Father   . Diabetes Father   . Hyperlipidemia Brother   . Cancer Maternal Aunt     Breast cancer  . Cancer Paternal Aunt     Breast Cancer    Social History:  reports that she has never smoked. She does not have any smokeless tobacco history on file. She reports that she does not drink alcohol or use illicit drugs.  Allergies:  Allergies  Allergen Reactions  . Gadolinium Derivatives Nausea Only    Nausea during injection  . Latex Rash    Medications: hydrochlorothiazide (HYDRODIURIL) 25 MG tablet (Taking) Sig - Route: Take 25 mg by mouth every morning. - Oral Class: Historical Med Number of times this order has been changed since signing: 1 Order [facility] Pediatric Multivitamins-Iron (FLINTSTONES PLUS IRON PO) (Taking) Sig - Route: Take 2 tablets by mouth every morning. - Oral Class: Historical Med Number of times this order has been changed since signing: 2 Order [address] phentermine (ADIPEX-P) 37.5 MG tablet (Taking) 30 tablet 2 11/19/2012 Sig - Route: Take 1 tablet (37.5 mg total) by mouth daily before breakfast. - Oral Class: Print    Please HPI for pertinent positives, otherwise complete 10 system ROS negative.  Physical Exam: BP 145/86  Pulse 100  Temp(Src) 97.6 F (36.4 C)  (Oral)  Resp 18  Ht 5\' 6"  (1.676 m)  Wt 279 lb (126.554 kg)  BMI 45.05 kg/m2  SpO2 100%  LMP 12/03/2012 Body mass index is 45.05 kg/(m^2).   General Appearance:  Alert, cooperative, no distress, appears stated age  Head:  Normocephalic, without obvious abnormality, atraumatic  ENT: Unremarkable  Neck: Supple, symmetrical, trachea midline  Lungs:   Clear to auscultation bilaterally, no w/r/r, respirations unlabored without use of accessory muscles.  Chest Wall:  No tenderness or deformity  Heart:  Regular rate and rhythm, S1, S2 normal, no murmur, rub or gallop.  Abdomen:   Soft, non-tender, non distended.  Extremities: Extremities normal, atraumatic, no cyanosis or edema  Pulses: 2+ and symmetric  Neurologic: Normal affect, no gross deficits.   Results for orders placed during the hospital encounter of 12/27/12 (from the past 48 hour(s))  APTT     Status: None   Collection Time    12/27/12  8:10 AM      Result Value Range   aPTT 27  24 - 37 seconds  BASIC METABOLIC PANEL     Status: Abnormal   Collection Time    12/27/12  8:10 AM      Result Value Range   Sodium 136  135 - 145 mEq/L   Potassium 3.5  3.5 - 5.1 mEq/L   Chloride 102  96 - 112 mEq/L   CO2 25  19 - 32 mEq/L   Glucose, Bld 101 (*) 70 - 99 mg/dL   BUN  11  6 - 23 mg/dL   Creatinine, Ser 1.61  0.50 - 1.10 mg/dL   Calcium 8.9  8.4 - 09.6 mg/dL   GFR calc non Af Amer >90  >90 mL/min   GFR calc Af Amer >90  >90 mL/min   Comment:            The eGFR has been calculated     using the CKD EPI equation.     This calculation has not been     validated in all clinical     situations.     eGFR's persistently     <90 mL/min signify     possible Chronic Kidney Disease.  CBC     Status: Abnormal   Collection Time    12/27/12  8:10 AM      Result Value Range   WBC 8.2  4.0 - 10.5 K/uL   RBC 4.37  3.87 - 5.11 MIL/uL   Hemoglobin 7.9 (*) 12.0 - 15.0 g/dL   HCT 04.5 (*) 40.9 - 81.1 %   MCV 60.4 (*) 78.0 - 100.0 fL    MCH 18.1 (*) 26.0 - 34.0 pg   MCHC 29.9 (*) 30.0 - 36.0 g/dL   RDW 91.4 (*) 78.2 - 95.6 %   Platelets 365  150 - 400 K/uL  HCG, SERUM, QUALITATIVE     Status: None   Collection Time    12/27/12  8:10 AM      Result Value Range   Preg, Serum NEGATIVE  NEGATIVE   Comment:            THE SENSITIVITY OF THIS     METHODOLOGY IS >10 mIU/mL.  PROTIME-INR     Status: None   Collection Time    12/27/12  8:10 AM      Result Value Range   Prothrombin Time 12.9  11.6 - 15.2 seconds   INR 0.99  0.00 - 1.49   No results found.  Assessment/Plan Symptomatic uterine fibroids. For [place] Discussed procedure, risks, complications, expectations for recovery. Plan for overnight admission for pain control and observation. Labs reviewed, ok Hgb down to 7.9, but no transfusion necessary at this point. Consent signed in chart  Brayton El PA-C 12/27/2012, 9:02 AM

## 2012-12-27 NOTE — Progress Notes (Signed)
Subjective: Pt resting in bed on floor. Rates pain ~5/10, no nausea.   Objective: Physical Exam: BP 140/80  Pulse 96  Temp(Src) 97.4 F (36.3 C) (Oral)  Resp 14  Ht 5\' 5"  (1.651 m)  Wt 282 lb 10.1 oz (128.2 kg)  BMI 47.03 kg/m2  SpO2 96%  LMP 12/03/2012 Abd: soft, ND, mildly tender across low abd. Rt groin soft, NT, no hematoma Dressing dry.   Labs: CBC  Recent Labs  12/27/12 0810  WBC 8.2  HGB 7.9*  HCT 26.4*  PLT 365   BMET  Recent Labs  12/27/12 0810  NA 136  K 3.5  CL 102  CO2 25  GLUCOSE 101*  BUN 11  CREATININE 0.69  CALCIUM 8.9   LFT No results found for this basename: PROT, ALBUMIN, AST, ALT, ALKPHOS, BILITOT, BILIDIR, IBILI, LIPASE,  in the last 72 hours PT/INR  Recent Labs  12/27/12 0810  LABPROT 12.9  INR 0.99     Studies/Results: Ir Angiogram Pelvis Selective Or Supraselective  12/27/2012   *RADIOLOGY REPORT*  Indication: 42 year old female with multiple uterine fibroids and menorrhagia.  PROCEDURE(S):  BILATERAL UTERINE ARTERY EMBOLIZATION WITH PELVIC ANGIOGRAPHY; ULTRASOUND GUIDANCE FOR VASCULAR ACCESS  Physician:  Rachelle Hora. Henn, MD  Medications: Versed 4 mg, Fentanyl 200 mcg.  Ancef 3 gm, Dilaudid 2 mg, Toradol 30 mg, Zofran 4 mg. A radiology nurse monitored the patient for moderate sedation.  As antibiotic prophylaxis, Ancef  was ordered pre-procedure and administered intravenously within one hour of incision.  Contrast:  80 ml Omnipaque-300  Moderate sedation time: 100 minutes  Fluoroscopy time: 33 minutes and 48 seconds  Procedure:Informed consent was obtained for the uterine artery embolization procedure.  The patient was placed supine on the interventional table.  The patient had palpable pedal pulses.  The right groin was prepped and draped in a sterile fashion.  Maximal barrier sterile technique was utilized including caps, mask, sterile gowns, sterile gloves, sterile drape, hand hygiene and skin antiseptic.  The skin was anesthetized  1% lidocaine.  Using ultrasound guidance, 21 gauge needle was directed in the right common femoral artery and a micropuncture dilator set was placed. The vascular access was upsized to a 5-French vascular sheath.  A Cobra catheter was used to cannulate the left common iliac artery and the left internal iliac artery.  A series of arteriograms were performed to identify the left uterine artery orfice.  A Progreat microcatheter was advanced into the left uterine artery.  Three vials of Embospheres (500 - 700 micron) were injected through the microcatheter with fluoroscopic guidance.  There was near complete stasis of the left uterine artery following administration of the particles. Followup angiograms were performed in order to confirm embolization of the left uterine artery.  A Waltman's loop was formed using the Cobra catheter and the right internal iliac artery was cannulated.  The right uterine artery was identified with contrast angiograms.  The microcatheter was advanced into the right uterine artery and a series of angiograms were performed. One and two-thirds vials of Embospheres (500-700 micron) were injected through the microcatheter with fluoroscopic guidance.  Followup angiograms demonstrated near complete stasis of the right uterine artery at the end of the procedure.  The microcatheter was removed. The Cobra catheter was removed.  Angiogram performed through the right groin sheath.  The vascular sheath was removed with an Exoseal closure device.  Findings:Large uterine arteries bilaterally.  Near complete stasis of the uterine arteries at the end of the procedure.  Fluoroscopic images were obtained for documentation.  Complicatons: None  Impression:Successful uterine artery embolization procedure.   Original Report Authenticated By: Richarda Overlie, M.D.   Ir Angiogram Pelvis Selective Or Supraselective  12/27/2012   *RADIOLOGY REPORT*  Indication: 42 year old female with multiple uterine fibroids and  menorrhagia.  PROCEDURE(S):  BILATERAL UTERINE ARTERY EMBOLIZATION WITH PELVIC ANGIOGRAPHY; ULTRASOUND GUIDANCE FOR VASCULAR ACCESS  Physician:  Rachelle Hora. Henn, MD  Medications: Versed 4 mg, Fentanyl 200 mcg.  Ancef 3 gm, Dilaudid 2 mg, Toradol 30 mg, Zofran 4 mg. A radiology nurse monitored the patient for moderate sedation.  As antibiotic prophylaxis, Ancef  was ordered pre-procedure and administered intravenously within one hour of incision.  Contrast:  80 ml Omnipaque-300  Moderate sedation time: 100 minutes  Fluoroscopy time: 33 minutes and 48 seconds  Procedure:Informed consent was obtained for the uterine artery embolization procedure.  The patient was placed supine on the interventional table.  The patient had palpable pedal pulses.  The right groin was prepped and draped in a sterile fashion.  Maximal barrier sterile technique was utilized including caps, mask, sterile gowns, sterile gloves, sterile drape, hand hygiene and skin antiseptic.  The skin was anesthetized 1% lidocaine.  Using ultrasound guidance, 21 gauge needle was directed in the right common femoral artery and a micropuncture dilator set was placed. The vascular access was upsized to a 5-French vascular sheath.  A Cobra catheter was used to cannulate the left common iliac artery and the left internal iliac artery.  A series of arteriograms were performed to identify the left uterine artery orfice.  A Progreat microcatheter was advanced into the left uterine artery.  Three vials of Embospheres (500 - 700 micron) were injected through the microcatheter with fluoroscopic guidance.  There was near complete stasis of the left uterine artery following administration of the particles. Followup angiograms were performed in order to confirm embolization of the left uterine artery.  A Waltman's loop was formed using the Cobra catheter and the right internal iliac artery was cannulated.  The right uterine artery was identified with contrast angiograms.   The microcatheter was advanced into the right uterine artery and a series of angiograms were performed. One and two-thirds vials of Embospheres (500-700 micron) were injected through the microcatheter with fluoroscopic guidance.  Followup angiograms demonstrated near complete stasis of the right uterine artery at the end of the procedure.  The microcatheter was removed. The Cobra catheter was removed.  Angiogram performed through the right groin sheath.  The vascular sheath was removed with an Exoseal closure device.  Findings:Large uterine arteries bilaterally.  Near complete stasis of the uterine arteries at the end of the procedure.  Fluoroscopic images were obtained for documentation.  Complicatons: None  Impression:Successful uterine artery embolization procedure.   Original Report Authenticated By: Richarda Overlie, M.D.   Ir Angiogram Selective Each Additional Vessel  12/27/2012   *RADIOLOGY REPORT*  Indication: 42 year old female with multiple uterine fibroids and menorrhagia.  PROCEDURE(S):  BILATERAL UTERINE ARTERY EMBOLIZATION WITH PELVIC ANGIOGRAPHY; ULTRASOUND GUIDANCE FOR VASCULAR ACCESS  Physician:  Rachelle Hora. Henn, MD  Medications: Versed 4 mg, Fentanyl 200 mcg.  Ancef 3 gm, Dilaudid 2 mg, Toradol 30 mg, Zofran 4 mg. A radiology nurse monitored the patient for moderate sedation.  As antibiotic prophylaxis, Ancef  was ordered pre-procedure and administered intravenously within one hour of incision.  Contrast:  80 ml Omnipaque-300  Moderate sedation time: 100 minutes  Fluoroscopy time: 33 minutes and 48 seconds  Procedure:Informed consent was obtained  for the uterine artery embolization procedure.  The patient was placed supine on the interventional table.  The patient had palpable pedal pulses.  The right groin was prepped and draped in a sterile fashion.  Maximal barrier sterile technique was utilized including caps, mask, sterile gowns, sterile gloves, sterile drape, hand hygiene and skin antiseptic.  The  skin was anesthetized 1% lidocaine.  Using ultrasound guidance, 21 gauge needle was directed in the right common femoral artery and a micropuncture dilator set was placed. The vascular access was upsized to a 5-French vascular sheath.  A Cobra catheter was used to cannulate the left common iliac artery and the left internal iliac artery.  A series of arteriograms were performed to identify the left uterine artery orfice.  A Progreat microcatheter was advanced into the left uterine artery.  Three vials of Embospheres (500 - 700 micron) were injected through the microcatheter with fluoroscopic guidance.  There was near complete stasis of the left uterine artery following administration of the particles. Followup angiograms were performed in order to confirm embolization of the left uterine artery.  A Waltman's loop was formed using the Cobra catheter and the right internal iliac artery was cannulated.  The right uterine artery was identified with contrast angiograms.  The microcatheter was advanced into the right uterine artery and a series of angiograms were performed. One and two-thirds vials of Embospheres (500-700 micron) were injected through the microcatheter with fluoroscopic guidance.  Followup angiograms demonstrated near complete stasis of the right uterine artery at the end of the procedure.  The microcatheter was removed. The Cobra catheter was removed.  Angiogram performed through the right groin sheath.  The vascular sheath was removed with an Exoseal closure device.  Findings:Large uterine arteries bilaterally.  Near complete stasis of the uterine arteries at the end of the procedure.  Fluoroscopic images were obtained for documentation.  Complicatons: None  Impression:Successful uterine artery embolization procedure.   Original Report Authenticated By: Richarda Overlie, M.D.   Ir Angiogram Selective Each Additional Vessel  12/27/2012   *RADIOLOGY REPORT*  Indication: 42 year old female with multiple  uterine fibroids and menorrhagia.  PROCEDURE(S):  BILATERAL UTERINE ARTERY EMBOLIZATION WITH PELVIC ANGIOGRAPHY; ULTRASOUND GUIDANCE FOR VASCULAR ACCESS  Physician:  Rachelle Hora. Henn, MD  Medications: Versed 4 mg, Fentanyl 200 mcg.  Ancef 3 gm, Dilaudid 2 mg, Toradol 30 mg, Zofran 4 mg. A radiology nurse monitored the patient for moderate sedation.  As antibiotic prophylaxis, Ancef  was ordered pre-procedure and administered intravenously within one hour of incision.  Contrast:  80 ml Omnipaque-300  Moderate sedation time: 100 minutes  Fluoroscopy time: 33 minutes and 48 seconds  Procedure:Informed consent was obtained for the uterine artery embolization procedure.  The patient was placed supine on the interventional table.  The patient had palpable pedal pulses.  The right groin was prepped and draped in a sterile fashion.  Maximal barrier sterile technique was utilized including caps, mask, sterile gowns, sterile gloves, sterile drape, hand hygiene and skin antiseptic.  The skin was anesthetized 1% lidocaine.  Using ultrasound guidance, 21 gauge needle was directed in the right common femoral artery and a micropuncture dilator set was placed. The vascular access was upsized to a 5-French vascular sheath.  A Cobra catheter was used to cannulate the left common iliac artery and the left internal iliac artery.  A series of arteriograms were performed to identify the left uterine artery orfice.  A Progreat microcatheter was advanced into the left uterine artery.  Three vials  of Embospheres (500 - 700 micron) were injected through the microcatheter with fluoroscopic guidance.  There was near complete stasis of the left uterine artery following administration of the particles. Followup angiograms were performed in order to confirm embolization of the left uterine artery.  A Waltman's loop was formed using the Cobra catheter and the right internal iliac artery was cannulated.  The right uterine artery was identified with  contrast angiograms.  The microcatheter was advanced into the right uterine artery and a series of angiograms were performed. One and two-thirds vials of Embospheres (500-700 micron) were injected through the microcatheter with fluoroscopic guidance.  Followup angiograms demonstrated near complete stasis of the right uterine artery at the end of the procedure.  The microcatheter was removed. The Cobra catheter was removed.  Angiogram performed through the right groin sheath.  The vascular sheath was removed with an Exoseal closure device.  Findings:Large uterine arteries bilaterally.  Near complete stasis of the uterine arteries at the end of the procedure.  Fluoroscopic images were obtained for documentation.  Complicatons: None  Impression:Successful uterine artery embolization procedure.   Original Report Authenticated By: Richarda Overlie, M.D.   Ir US Guide Vasc Access Right  12/27/2012   *RADIOLOGY REPORT*  Indication: 42 year old female with multiple uterine fibroids and menorrhagia.  PROCEDURE(S):  BILATERAL UTERINE ARTERY EMBOLIZATION WITH PELVIC ANGIOGRAPHY; ULTRASOUND GUIDANCE FOR VASCULAR ACCESS  Physician:  Rachelle Hora. Henn, MD  Medications: Versed 4 mg, Fentanyl 200 mcg.  Ancef 3 gm, Dilaudid 2 mg, Toradol 30 mg, Zofran 4 mg. A radiology nurse monitored the patient for moderate sedation.  As antibiotic prophylaxis, Ancef  was ordered pre-procedure and administered intravenously within one hour of incision.  Contrast:  80 ml Omnipaque-300  Moderate sedation time: 100 minutes  Fluoroscopy time: 33 minutes and 48 seconds  Procedure:Informed consent was obtained for the uterine artery embolization procedure.  The patient was placed supine on the interventional table.  The patient had palpable pedal pulses.  The right groin was prepped and draped in a sterile fashion.  Maximal barrier sterile technique was utilized including caps, mask, sterile gowns, sterile gloves, sterile drape, hand hygiene and skin antiseptic.   The skin was anesthetized 1% lidocaine.  Using ultrasound guidance, 21 gauge needle was directed in the right common femoral artery and a micropuncture dilator set was placed. The vascular access was upsized to a 5-French vascular sheath.  A Cobra catheter was used to cannulate the left common iliac artery and the left internal iliac artery.  A series of arteriograms were performed to identify the left uterine artery orfice.  A Progreat microcatheter was advanced into the left uterine artery.  Three vials of Embospheres (500 - 700 micron) were injected through the microcatheter with fluoroscopic guidance.  There was near complete stasis of the left uterine artery following administration of the particles. Followup angiograms were performed in order to confirm embolization of the left uterine artery.  A Waltman's loop was formed using the Cobra catheter and the right internal iliac artery was cannulated.  The right uterine artery was identified with contrast angiograms.  The microcatheter was advanced into the right uterine artery and a series of angiograms were performed. One and two-thirds vials of Embospheres (500-700 micron) were injected through the microcatheter with fluoroscopic guidance.  Followup angiograms demonstrated near complete stasis of the right uterine artery at the end of the procedure.  The microcatheter was removed. The Cobra catheter was removed.  Angiogram performed through the right groin sheath.  The vascular sheath was removed with an Exoseal closure device.  Findings:Large uterine arteries bilaterally.  Near complete stasis of the uterine arteries at the end of the procedure.  Fluoroscopic images were obtained for documentation.  Complicatons: None  Impression:Successful uterine artery embolization procedure.   Original Report Authenticated By: Richarda Overlie, M.D.   Ir Embo Tumor Organ Ischemia Infarct Inc Guide Roadmapping  12/27/2012   *RADIOLOGY REPORT*  Indication: 42 year old female  with multiple uterine fibroids and menorrhagia.  PROCEDURE(S):  BILATERAL UTERINE ARTERY EMBOLIZATION WITH PELVIC ANGIOGRAPHY; ULTRASOUND GUIDANCE FOR VASCULAR ACCESS  Physician:  Rachelle Hora. Henn, MD  Medications: Versed 4 mg, Fentanyl 200 mcg.  Ancef 3 gm, Dilaudid 2 mg, Toradol 30 mg, Zofran 4 mg. A radiology nurse monitored the patient for moderate sedation.  As antibiotic prophylaxis, Ancef  was ordered pre-procedure and administered intravenously within one hour of incision.  Contrast:  80 ml Omnipaque-300  Moderate sedation time: 100 minutes  Fluoroscopy time: 33 minutes and 48 seconds  Procedure:Informed consent was obtained for the uterine artery embolization procedure.  The patient was placed supine on the interventional table.  The patient had palpable pedal pulses.  The right groin was prepped and draped in a sterile fashion.  Maximal barrier sterile technique was utilized including caps, mask, sterile gowns, sterile gloves, sterile drape, hand hygiene and skin antiseptic.  The skin was anesthetized 1% lidocaine.  Using ultrasound guidance, 21 gauge needle was directed in the right common femoral artery and a micropuncture dilator set was placed. The vascular access was upsized to a 5-French vascular sheath.  A Cobra catheter was used to cannulate the left common iliac artery and the left internal iliac artery.  A series of arteriograms were performed to identify the left uterine artery orfice.  A Progreat microcatheter was advanced into the left uterine artery.  Three vials of Embospheres (500 - 700 micron) were injected through the microcatheter with fluoroscopic guidance.  There was near complete stasis of the left uterine artery following administration of the particles. Followup angiograms were performed in order to confirm embolization of the left uterine artery.  A Waltman's loop was formed using the Cobra catheter and the right internal iliac artery was cannulated.  The right uterine artery was  identified with contrast angiograms.  The microcatheter was advanced into the right uterine artery and a series of angiograms were performed. One and two-thirds vials of Embospheres (500-700 micron) were injected through the microcatheter with fluoroscopic guidance.  Followup angiograms demonstrated near complete stasis of the right uterine artery at the end of the procedure.  The microcatheter was removed. The Cobra catheter was removed.  Angiogram performed through the right groin sheath.  The vascular sheath was removed with an Exoseal closure device.  Findings:Large uterine arteries bilaterally.  Near complete stasis of the uterine arteries at the end of the procedure.  Fluoroscopic images were obtained for documentation.  Complicatons: None  Impression:Successful uterine artery embolization procedure.   Original Report Authenticated By: Richarda Overlie, M.D.    Assessment/Plan: S/p Uterine Artery Embolization Pain control Foley out later today Hopefully OOB, reg diet, and discharge home tomorrow morning. Discussed with family.    LOS: 0 days    Brayton El PA-C 12/27/2012 3:20 PM

## 2012-12-27 NOTE — Care Management Note (Signed)
CARE MANAGEMENT NOTE 12/27/2012  Patient:  Elizabeth Harper, Elizabeth Harper   Account Number:  [id number]  Date Initiated:  12/27/2012  Documentation initiated by:  Jaylon Boylen  Subjective/Objective Assessment:   42 yo female admitted with uterine artery embolization.     Action/Plan:   home when stable   Anticipated DC Date:  12/28/2012   Anticipated DC Plan:  HOME/SELF CARE      DC Planning Services  CM consult      Choice offered to / List presented to:  NA   DME arranged  NA      DME agency  NA     HH arranged  NA      HH agency  NA   Status of service:  Completed, signed off Medicare Important Message given?   (If response is "NO", the following Medicare IM given date fields will be blank) Date Medicare IM given:   Date Additional Medicare IM given:    Discharge Disposition:    Per UR Regulation:  Reviewed for med. necessity/level of care/duration of stay  If discussed at Long Length of Stay Meetings, dates discussed:    Comments:  12/27/12 1351 Carlyn Mullenbach,RN,BSN [phone] Chart reviewed for utilization of services. No needs identified at this time.

## 2012-12-27 NOTE — Procedures (Signed)
Successful uterine artery embolization procedure. No immediate complication.  See dictated report.

## 2012-12-27 NOTE — ED Notes (Signed)
Beginning Right side embolization.

## 2012-12-27 NOTE — ED Notes (Signed)
Beginning Left side embolization.

## 2012-12-28 ENCOUNTER — Other Ambulatory Visit: Payer: Self-pay | Admitting: Radiology

## 2012-12-28 DIAGNOSIS — D259 Leiomyoma of uterus, unspecified: Secondary | ICD-10-CM

## 2012-12-28 LAB — CBC WITH DIFFERENTIAL/PLATELET
Basophils Absolute: 0 10*3/uL (ref 0.0–0.1)
Basophils Relative: 0 % (ref 0–1)
Eosinophils Absolute: 0.1 10*3/uL (ref 0.0–0.7)
Eosinophils Relative: 1 % (ref 0–5)
HCT: 27.2 % — ABNORMAL LOW (ref 36.0–46.0)
Hemoglobin: 8.3 g/dL — ABNORMAL LOW (ref 12.0–15.0)
Lymphocytes Relative: 18 % (ref 12–46)
Lymphs Abs: 1.9 10*3/uL (ref 0.7–4.0)
MCH: 18.6 pg — ABNORMAL LOW (ref 26.0–34.0)
MCHC: 30.5 g/dL (ref 30.0–36.0)
MCV: 60.9 fL — ABNORMAL LOW (ref 78.0–100.0)
Monocytes Absolute: 0.6 10*3/uL (ref 0.1–1.0)
Monocytes Relative: 6 % (ref 3–12)
Neutro Abs: 8 10*3/uL — ABNORMAL HIGH (ref 1.7–7.7)
Neutrophils Relative %: 75 % (ref 43–77)
Platelets: 394 10*3/uL (ref 150–400)
RBC: 4.47 MIL/uL (ref 3.87–5.11)
RDW: 17.3 % — ABNORMAL HIGH (ref 11.5–15.5)
WBC: 10.6 10*3/uL — ABNORMAL HIGH (ref 4.0–10.5)

## 2012-12-28 MED ORDER — ONDANSETRON HCL 4 MG PO TABS: 4.0000 mg | ORAL_TABLET | Freq: Four times a day (QID) | ORAL | Status: DC | PRN

## 2012-12-28 MED ORDER — DIPHENHYDRAMINE HCL 12.5 MG/5ML PO ELIX: 12.5000 mg | ORAL_SOLUTION | Freq: Four times a day (QID) | ORAL | Status: DC | PRN

## 2012-12-28 MED ORDER — SODIUM CHLORIDE 0.9 % IJ SOLN: 9.0000 mL | INTRAMUSCULAR | Status: DC | PRN

## 2012-12-28 MED ORDER — OXYCODONE HCL 5 MG PO TABS
5.0000 mg | ORAL_TABLET | ORAL | Status: DC | PRN
  Administered 2012-12-28: 5 mg via ORAL
  Filled 2012-12-28: qty 1

## 2012-12-28 MED ORDER — KETOROLAC TROMETHAMINE 30 MG/ML IJ SOLN
30.0000 mg | Freq: Once | INTRAMUSCULAR | Status: AC
  Administered 2012-12-28: 30 mg via INTRAVENOUS
  Filled 2012-12-28: qty 1

## 2012-12-28 MED ORDER — HYDROMORPHONE 0.3 MG/ML IV SOLN
INTRAVENOUS | Status: DC
  Administered 2012-12-28 (×2): 0.9 mg via INTRAVENOUS

## 2012-12-28 MED ORDER — OXYCODONE HCL 5 MG PO TABS: 5.0000 mg | ORAL_TABLET | ORAL | Status: DC | PRN

## 2012-12-28 MED ORDER — NALOXONE HCL 0.4 MG/ML IJ SOLN: 0.4000 mg | INTRAMUSCULAR | Status: DC | PRN

## 2012-12-28 MED ORDER — IBUPROFEN 600 MG PO TABS: 600.0000 mg | ORAL_TABLET | Freq: Three times a day (TID) | ORAL | Status: DC

## 2012-12-28 MED ORDER — DSS 100 MG PO CAPS: 100.0000 mg | ORAL_CAPSULE | Freq: Two times a day (BID) | ORAL | Status: DC

## 2012-12-28 MED ORDER — ONDANSETRON HCL 4 MG/2ML IJ SOLN
4.0000 mg | Freq: Four times a day (QID) | INTRAMUSCULAR | Status: DC | PRN
  Administered 2012-12-28: 4 mg via INTRAVENOUS
  Filled 2012-12-28: qty 2

## 2012-12-28 MED ORDER — DIPHENHYDRAMINE HCL 50 MG/ML IJ SOLN: 12.5000 mg | Freq: Four times a day (QID) | INTRAMUSCULAR | Status: DC | PRN

## 2012-12-28 NOTE — Discharge Summary (Signed)
Physician Discharge Summary  Patient ID: Elizabeth Harper MRN: [id number] DOB/AGE: Apr 15, 1971 42 y.o.  Admit date: 12/27/2012 Discharge date: 12/28/2012  Admission Diagnoses: Principal Problem:   Fibroid uterus  Discharge Diagnoses:  Principal Problem:   Fibroid uterus    Procedures: S/p Uterine artery embolization 6/30  Discharged Condition: good  Hospital Course: HPI: Elizabeth Harper is an 41 y.o. female with symptomatic uterine fibroids. She has had MRI and has seen Dr. Lowella Dandy in consult. After discussion of treatment options, she is scheduled for uterine artery embolization.  She has had some recent heavy menses earlier this month, but otherwise feels ok.  No recent fevers, illness.  Pt underwent successful [place] on 6/30 without immediate complications. Admitted to floor in stable condition. Foley was removed and pt able to void on own. Felt nauseous and dizzy, likely secondary to PCA Dilaudid, as this improved/resolved after it was stopped. Has tolerated some diet. Pain better controlled with Toradol and po Oxy. Has been OOB some as well. Reviewed medications and instructions in detail. Follow up plans as below.   Consults: None   Discharge Exam: Blood pressure 150/90, pulse 89, temperature 98 F (36.7 C), temperature source Oral, resp. rate 16, height 5\' 5"  (1.651 m), weight 282 lb 10.1 oz (128.2 kg), last menstrual period 12/03/2012, SpO2 99.00%. Lungs: CTA without w/r/r Heart: Regular Abdomen: soft, mild low abd tenderness Ext: rt groin soft, no hematoma  Disposition: 01-Home or Self Care      Discharge Orders   Future Orders Complete By Expires     Call MD for:  persistant nausea and vomiting  As directed     Call MD for:  redness, tenderness, or signs of infection (pain, swelling, redness, odor or green/yellow discharge around incision site)  As directed     Call MD for:  severe uncontrolled pain  As directed     Call MD for:  temperature >100.4  As directed      Diet - low sodium heart healthy  As directed     Driving Restrictions  As directed     Comments:      Limit driving for first few days    Increase activity slowly  As directed     May shower / Bathe  As directed     May walk up steps  As directed     No wound care  As directed         Medication List         acetaminophen 325 MG tablet  Commonly known as:  TYLENOL  Take 325 mg by mouth every 6 (six) hours as needed for pain (headache).     DSS 100 MG Caps  Take 100 mg by mouth 2 (two) times daily.     hydrochlorothiazide 25 MG tablet  Commonly known as:  HYDRODIURIL  Take 25 mg by mouth every morning.     ibuprofen 600 MG tablet  Commonly known as:  ADVIL,MOTRIN  Take 1 tablet (600 mg total) by mouth 3 (three) times daily after meals.     ondansetron 4 MG tablet  Commonly known as:  ZOFRAN  Take 1 tablet (4 mg total) by mouth every 6 (six) hours as needed for nausea.     oxyCODONE 5 MG immediate release tablet  Commonly known as:  Oxy IR/ROXICODONE  Take 1 tablet (5 mg total) by mouth every 4 (four) hours as needed for pain.     phentermine 37.5 MG tablet  Commonly  known as:  ADIPEX-P  Take 1 tablet (37.5 mg total) by mouth daily before breakfast.       Follow-up Information   Follow up with Abundio Miu, MD. Schedule an appointment as soon as possible for a visit in 2 weeks. (Office will call)    Contact information:   [facility] [phone]       Signed: Brayton El PA-C 12/28/2012, 11:57 AM

## 2012-12-28 NOTE — Progress Notes (Signed)
Pt ready for d/c. This RN went over d/c instructions with pt. Instructed to make a follow-up appointment in two weeks, educated pt on signs and symptoms of infection and to monitor incision site. PIV removed, WNL. No change in pt condition since AM assessment. Pt to finish lunch then d/c to home with her daughters. Eugene Garnet RN  Pt d/c'd to home. Eugene Garnet RN

## 2012-12-28 NOTE — Progress Notes (Signed)
Subjective: Pt had a lot of pain overnight with some nausea as well. Foley out and voiding well on her own. She also had some vaginal bleeding overnight as well that seems to have stopped this am. Also feels a little dizzy but has been able to drink some coffee this am. Denies issues with groin or right leg  Objective: Physical Exam: BP 150/90  Pulse 89  Temp(Src) 98 F (36.7 C) (Oral)  Resp 16  Ht 5\' 5"  (1.651 m)  Wt 282 lb 10.1 oz (128.2 kg)  BMI 47.03 kg/m2  SpO2 100%  LMP 12/03/2012 Abd: soft, mildly tender low abd Rt groin: soft, NT, no bleeding or hematoma. Legs warm.    Labs: CBC  Recent Labs  12/27/12 0810  WBC 8.2  HGB 7.9*  HCT 26.4*  PLT 365   BMET  Recent Labs  12/27/12 0810  NA 136  K 3.5  CL 102  CO2 25  GLUCOSE 101*  BUN 11  CREATININE 0.69  CALCIUM 8.9   LFT No results found for this basename: PROT, ALBUMIN, AST, ALT, ALKPHOS, BILITOT, BILIDIR, IBILI, LIPASE,  in the last 72 hours PT/INR  Recent Labs  12/27/12 0810  LABPROT 12.9  INR 0.99     Studies/Results: Ir Angiogram Pelvis Selective Or Supraselective  12/27/2012   *RADIOLOGY REPORT*  Indication: 42 year old female with multiple uterine fibroids and menorrhagia.  PROCEDURE(S):  BILATERAL UTERINE ARTERY EMBOLIZATION WITH PELVIC ANGIOGRAPHY; ULTRASOUND GUIDANCE FOR VASCULAR ACCESS  Physician:  Rachelle Hora. Henn, MD  Medications: Versed 4 mg, Fentanyl 200 mcg.  Ancef 3 gm, Dilaudid 2 mg, Toradol 30 mg, Zofran 4 mg. A radiology nurse monitored the patient for moderate sedation.  As antibiotic prophylaxis, Ancef  was ordered pre-procedure and administered intravenously within one hour of incision.  Contrast:  80 ml Omnipaque-300  Moderate sedation time: 100 minutes  Fluoroscopy time: 33 minutes and 48 seconds  Procedure:Informed consent was obtained for the uterine artery embolization procedure.  The patient was placed supine on the interventional table.  The patient had palpable pedal  pulses.  The right groin was prepped and draped in a sterile fashion.  Maximal barrier sterile technique was utilized including caps, mask, sterile gowns, sterile gloves, sterile drape, hand hygiene and skin antiseptic.  The skin was anesthetized 1% lidocaine.  Using ultrasound guidance, 21 gauge needle was directed in the right common femoral artery and a micropuncture dilator set was placed. The vascular access was upsized to a 5-French vascular sheath.  A Cobra catheter was used to cannulate the left common iliac artery and the left internal iliac artery.  A series of arteriograms were performed to identify the left uterine artery orfice.  A Progreat microcatheter was advanced into the left uterine artery.  Three vials of Embospheres (500 - 700 micron) were injected through the microcatheter with fluoroscopic guidance.  There was near complete stasis of the left uterine artery following administration of the particles. Followup angiograms were performed in order to confirm embolization of the left uterine artery.  A Waltman's loop was formed using the Cobra catheter and the right internal iliac artery was cannulated.  The right uterine artery was identified with contrast angiograms.  The microcatheter was advanced into the right uterine artery and a series of angiograms were performed. One and two-thirds vials of Embospheres (500-700 micron) were injected through the microcatheter with fluoroscopic guidance.  Followup angiograms demonstrated near complete stasis of the right uterine artery at the end of the procedure.  The  microcatheter was removed. The Cobra catheter was removed.  Angiogram performed through the right groin sheath.  The vascular sheath was removed with an Exoseal closure device.  Findings:Large uterine arteries bilaterally.  Near complete stasis of the uterine arteries at the end of the procedure.  Fluoroscopic images were obtained for documentation.  Complicatons: None  Impression:Successful  uterine artery embolization procedure.   Original Report Authenticated By: Richarda Overlie, M.D.   Ir Angiogram Pelvis Selective Or Supraselective  12/27/2012   *RADIOLOGY REPORT*  Indication: 42 year old female with multiple uterine fibroids and menorrhagia.  PROCEDURE(S):  BILATERAL UTERINE ARTERY EMBOLIZATION WITH PELVIC ANGIOGRAPHY; ULTRASOUND GUIDANCE FOR VASCULAR ACCESS  Physician:  Rachelle Hora. Henn, MD  Medications: Versed 4 mg, Fentanyl 200 mcg.  Ancef 3 gm, Dilaudid 2 mg, Toradol 30 mg, Zofran 4 mg. A radiology nurse monitored the patient for moderate sedation.  As antibiotic prophylaxis, Ancef  was ordered pre-procedure and administered intravenously within one hour of incision.  Contrast:  80 ml Omnipaque-300  Moderate sedation time: 100 minutes  Fluoroscopy time: 33 minutes and 48 seconds  Procedure:Informed consent was obtained for the uterine artery embolization procedure.  The patient was placed supine on the interventional table.  The patient had palpable pedal pulses.  The right groin was prepped and draped in a sterile fashion.  Maximal barrier sterile technique was utilized including caps, mask, sterile gowns, sterile gloves, sterile drape, hand hygiene and skin antiseptic.  The skin was anesthetized 1% lidocaine.  Using ultrasound guidance, 21 gauge needle was directed in the right common femoral artery and a micropuncture dilator set was placed. The vascular access was upsized to a 5-French vascular sheath.  A Cobra catheter was used to cannulate the left common iliac artery and the left internal iliac artery.  A series of arteriograms were performed to identify the left uterine artery orfice.  A Progreat microcatheter was advanced into the left uterine artery.  Three vials of Embospheres (500 - 700 micron) were injected through the microcatheter with fluoroscopic guidance.  There was near complete stasis of the left uterine artery following administration of the particles. Followup angiograms were  performed in order to confirm embolization of the left uterine artery.  A Waltman's loop was formed using the Cobra catheter and the right internal iliac artery was cannulated.  The right uterine artery was identified with contrast angiograms.  The microcatheter was advanced into the right uterine artery and a series of angiograms were performed. One and two-thirds vials of Embospheres (500-700 micron) were injected through the microcatheter with fluoroscopic guidance.  Followup angiograms demonstrated near complete stasis of the right uterine artery at the end of the procedure.  The microcatheter was removed. The Cobra catheter was removed.  Angiogram performed through the right groin sheath.  The vascular sheath was removed with an Exoseal closure device.  Findings:Large uterine arteries bilaterally.  Near complete stasis of the uterine arteries at the end of the procedure.  Fluoroscopic images were obtained for documentation.  Complicatons: None  Impression:Successful uterine artery embolization procedure.   Original Report Authenticated By: Richarda Overlie, M.D.   Ir Angiogram Selective Each Additional Vessel  12/27/2012   *RADIOLOGY REPORT*  Indication: 42 year old female with multiple uterine fibroids and menorrhagia.  PROCEDURE(S):  BILATERAL UTERINE ARTERY EMBOLIZATION WITH PELVIC ANGIOGRAPHY; ULTRASOUND GUIDANCE FOR VASCULAR ACCESS  Physician:  Rachelle Hora. Henn, MD  Medications: Versed 4 mg, Fentanyl 200 mcg.  Ancef 3 gm, Dilaudid 2 mg, Toradol 30 mg, Zofran 4 mg. A radiology nurse monitored  the patient for moderate sedation.  As antibiotic prophylaxis, Ancef  was ordered pre-procedure and administered intravenously within one hour of incision.  Contrast:  80 ml Omnipaque-300  Moderate sedation time: 100 minutes  Fluoroscopy time: 33 minutes and 48 seconds  Procedure:Informed consent was obtained for the uterine artery embolization procedure.  The patient was placed supine on the interventional table.  The patient  had palpable pedal pulses.  The right groin was prepped and draped in a sterile fashion.  Maximal barrier sterile technique was utilized including caps, mask, sterile gowns, sterile gloves, sterile drape, hand hygiene and skin antiseptic.  The skin was anesthetized 1% lidocaine.  Using ultrasound guidance, 21 gauge needle was directed in the right common femoral artery and a micropuncture dilator set was placed. The vascular access was upsized to a 5-French vascular sheath.  A Cobra catheter was used to cannulate the left common iliac artery and the left internal iliac artery.  A series of arteriograms were performed to identify the left uterine artery orfice.  A Progreat microcatheter was advanced into the left uterine artery.  Three vials of Embospheres (500 - 700 micron) were injected through the microcatheter with fluoroscopic guidance.  There was near complete stasis of the left uterine artery following administration of the particles. Followup angiograms were performed in order to confirm embolization of the left uterine artery.  A Waltman's loop was formed using the Cobra catheter and the right internal iliac artery was cannulated.  The right uterine artery was identified with contrast angiograms.  The microcatheter was advanced into the right uterine artery and a series of angiograms were performed. One and two-thirds vials of Embospheres (500-700 micron) were injected through the microcatheter with fluoroscopic guidance.  Followup angiograms demonstrated near complete stasis of the right uterine artery at the end of the procedure.  The microcatheter was removed. The Cobra catheter was removed.  Angiogram performed through the right groin sheath.  The vascular sheath was removed with an Exoseal closure device.  Findings:Large uterine arteries bilaterally.  Near complete stasis of the uterine arteries at the end of the procedure.  Fluoroscopic images were obtained for documentation.  Complicatons: None   Impression:Successful uterine artery embolization procedure.   Original Report Authenticated By: Richarda Overlie, M.D.   Ir Angiogram Selective Each Additional Vessel  12/27/2012   *RADIOLOGY REPORT*  Indication: 42 year old female with multiple uterine fibroids and menorrhagia.  PROCEDURE(S):  BILATERAL UTERINE ARTERY EMBOLIZATION WITH PELVIC ANGIOGRAPHY; ULTRASOUND GUIDANCE FOR VASCULAR ACCESS  Physician:  Rachelle Hora. Henn, MD  Medications: Versed 4 mg, Fentanyl 200 mcg.  Ancef 3 gm, Dilaudid 2 mg, Toradol 30 mg, Zofran 4 mg. A radiology nurse monitored the patient for moderate sedation.  As antibiotic prophylaxis, Ancef  was ordered pre-procedure and administered intravenously within one hour of incision.  Contrast:  80 ml Omnipaque-300  Moderate sedation time: 100 minutes  Fluoroscopy time: 33 minutes and 48 seconds  Procedure:Informed consent was obtained for the uterine artery embolization procedure.  The patient was placed supine on the interventional table.  The patient had palpable pedal pulses.  The right groin was prepped and draped in a sterile fashion.  Maximal barrier sterile technique was utilized including caps, mask, sterile gowns, sterile gloves, sterile drape, hand hygiene and skin antiseptic.  The skin was anesthetized 1% lidocaine.  Using ultrasound guidance, 21 gauge needle was directed in the right common femoral artery and a micropuncture dilator set was placed. The vascular access was upsized to a 5-French vascular sheath.  A Cobra catheter was used to cannulate the left common iliac artery and the left internal iliac artery.  A series of arteriograms were performed to identify the left uterine artery orfice.  A Progreat microcatheter was advanced into the left uterine artery.  Three vials of Embospheres (500 - 700 micron) were injected through the microcatheter with fluoroscopic guidance.  There was near complete stasis of the left uterine artery following administration of the particles. Followup  angiograms were performed in order to confirm embolization of the left uterine artery.  A Waltman's loop was formed using the Cobra catheter and the right internal iliac artery was cannulated.  The right uterine artery was identified with contrast angiograms.  The microcatheter was advanced into the right uterine artery and a series of angiograms were performed. One and two-thirds vials of Embospheres (500-700 micron) were injected through the microcatheter with fluoroscopic guidance.  Followup angiograms demonstrated near complete stasis of the right uterine artery at the end of the procedure.  The microcatheter was removed. The Cobra catheter was removed.  Angiogram performed through the right groin sheath.  The vascular sheath was removed with an Exoseal closure device.  Findings:Large uterine arteries bilaterally.  Near complete stasis of the uterine arteries at the end of the procedure.  Fluoroscopic images were obtained for documentation.  Complicatons: None  Impression:Successful uterine artery embolization procedure.   Original Report Authenticated By: Richarda Overlie, M.D.   Ir US Guide Vasc Access Right  12/27/2012   *RADIOLOGY REPORT*  Indication: 42 year old female with multiple uterine fibroids and menorrhagia.  PROCEDURE(S):  BILATERAL UTERINE ARTERY EMBOLIZATION WITH PELVIC ANGIOGRAPHY; ULTRASOUND GUIDANCE FOR VASCULAR ACCESS  Physician:  Rachelle Hora. Henn, MD  Medications: Versed 4 mg, Fentanyl 200 mcg.  Ancef 3 gm, Dilaudid 2 mg, Toradol 30 mg, Zofran 4 mg. A radiology nurse monitored the patient for moderate sedation.  As antibiotic prophylaxis, Ancef  was ordered pre-procedure and administered intravenously within one hour of incision.  Contrast:  80 ml Omnipaque-300  Moderate sedation time: 100 minutes  Fluoroscopy time: 33 minutes and 48 seconds  Procedure:Informed consent was obtained for the uterine artery embolization procedure.  The patient was placed supine on the interventional table.  The patient  had palpable pedal pulses.  The right groin was prepped and draped in a sterile fashion.  Maximal barrier sterile technique was utilized including caps, mask, sterile gowns, sterile gloves, sterile drape, hand hygiene and skin antiseptic.  The skin was anesthetized 1% lidocaine.  Using ultrasound guidance, 21 gauge needle was directed in the right common femoral artery and a micropuncture dilator set was placed. The vascular access was upsized to a 5-French vascular sheath.  A Cobra catheter was used to cannulate the left common iliac artery and the left internal iliac artery.  A series of arteriograms were performed to identify the left uterine artery orfice.  A Progreat microcatheter was advanced into the left uterine artery.  Three vials of Embospheres (500 - 700 micron) were injected through the microcatheter with fluoroscopic guidance.  There was near complete stasis of the left uterine artery following administration of the particles. Followup angiograms were performed in order to confirm embolization of the left uterine artery.  A Waltman's loop was formed using the Cobra catheter and the right internal iliac artery was cannulated.  The right uterine artery was identified with contrast angiograms.  The microcatheter was advanced into the right uterine artery and a series of angiograms were performed. One and two-thirds vials of Embospheres (500-700  micron) were injected through the microcatheter with fluoroscopic guidance.  Followup angiograms demonstrated near complete stasis of the right uterine artery at the end of the procedure.  The microcatheter was removed. The Cobra catheter was removed.  Angiogram performed through the right groin sheath.  The vascular sheath was removed with an Exoseal closure device.  Findings:Large uterine arteries bilaterally.  Near complete stasis of the uterine arteries at the end of the procedure.  Fluoroscopic images were obtained for documentation.  Complicatons: None   Impression:Successful uterine artery embolization procedure.   Original Report Authenticated By: Richarda Overlie, M.D.   Ir Embo Tumor Organ Ischemia Infarct Inc Guide Roadmapping  12/27/2012   *RADIOLOGY REPORT*  Indication: 42 year old female with multiple uterine fibroids and menorrhagia.  PROCEDURE(S):  BILATERAL UTERINE ARTERY EMBOLIZATION WITH PELVIC ANGIOGRAPHY; ULTRASOUND GUIDANCE FOR VASCULAR ACCESS  Physician:  Rachelle Hora. Henn, MD  Medications: Versed 4 mg, Fentanyl 200 mcg.  Ancef 3 gm, Dilaudid 2 mg, Toradol 30 mg, Zofran 4 mg. A radiology nurse monitored the patient for moderate sedation.  As antibiotic prophylaxis, Ancef  was ordered pre-procedure and administered intravenously within one hour of incision.  Contrast:  80 ml Omnipaque-300  Moderate sedation time: 100 minutes  Fluoroscopy time: 33 minutes and 48 seconds  Procedure:Informed consent was obtained for the uterine artery embolization procedure.  The patient was placed supine on the interventional table.  The patient had palpable pedal pulses.  The right groin was prepped and draped in a sterile fashion.  Maximal barrier sterile technique was utilized including caps, mask, sterile gowns, sterile gloves, sterile drape, hand hygiene and skin antiseptic.  The skin was anesthetized 1% lidocaine.  Using ultrasound guidance, 21 gauge needle was directed in the right common femoral artery and a micropuncture dilator set was placed. The vascular access was upsized to a 5-French vascular sheath.  A Cobra catheter was used to cannulate the left common iliac artery and the left internal iliac artery.  A series of arteriograms were performed to identify the left uterine artery orfice.  A Progreat microcatheter was advanced into the left uterine artery.  Three vials of Embospheres (500 - 700 micron) were injected through the microcatheter with fluoroscopic guidance.  There was near complete stasis of the left uterine artery following administration of the  particles. Followup angiograms were performed in order to confirm embolization of the left uterine artery.  A Waltman's loop was formed using the Cobra catheter and the right internal iliac artery was cannulated.  The right uterine artery was identified with contrast angiograms.  The microcatheter was advanced into the right uterine artery and a series of angiograms were performed. One and two-thirds vials of Embospheres (500-700 micron) were injected through the microcatheter with fluoroscopic guidance.  Followup angiograms demonstrated near complete stasis of the right uterine artery at the end of the procedure.  The microcatheter was removed. The Cobra catheter was removed.  Angiogram performed through the right groin sheath.  The vascular sheath was removed with an Exoseal closure device.  Findings:Large uterine arteries bilaterally.  Near complete stasis of the uterine arteries at the end of the procedure.  Fluoroscopic images were obtained for documentation.  Complicatons: None  Impression:Successful uterine artery embolization procedure.   Original Report Authenticated By: Richarda Overlie, M.D.    Assessment/Plan: S/p [place] Still with pain issues, will DC PCA and start Toradol and po Oxy Dizziness and vaginal bleeding overnight, check CBC Gradually advance diet if improving nausea. Hopefully Hgb will be stable and can  DC later today. Will d/w attending IR Dr. Grace Isaac.    LOS: 1 day    Brayton El PA-C 12/28/2012 10:15 AM

## 2012-12-29 ENCOUNTER — Telehealth: Payer: Self-pay | Admitting: Radiology

## 2012-12-29 NOTE — Telephone Encounter (Signed)
S/p:  [place] (12/27/2012)  Afebrile.  Spotting, questionable menstrual cycle.  Cramping.  Taking Oxycodone, Ibuprofen & stool softener as directed.  Has Zofran to take as needed.    Appetite:  Poor-fair.  Encouraged patient to push fluids & try to advance to Soft Diet.    Followup appointment for 01/20/2013 at 10:00 am.  Instructed patient to call if needed for questions or concerns.  Marisa Hage Carmell [place], RN 12/29/2012 11:10 AM

## 2012-12-31 LAB — VITAMIN D 1,25 DIHYDROXY
Vitamin D 1, 25 (OH)2 Total: 41 pg/mL (ref 18–72)
Vitamin D2 1, 25 (OH)2: <8 pg/mL
Vitamin D3 1, 25 (OH)2: 41 pg/mL

## 2013-01-20 ENCOUNTER — Inpatient Hospital Stay: Admission: RE | Admit: 2013-01-20 | Payer: BC Managed Care – PPO | Source: Ambulatory Visit

## 2013-02-16 ENCOUNTER — Ambulatory Visit
Admission: RE | Admit: 2013-02-16 | Discharge: 2013-02-16 | Disposition: A | Discharge status: Home / Self Care | Payer: BC Managed Care – PPO | Source: Ambulatory Visit | Attending: Radiology | Admitting: Radiology

## 2013-02-16 DIAGNOSIS — D259 Leiomyoma of uterus, unspecified: Secondary | ICD-10-CM

## 2013-02-16 NOTE — Progress Notes (Signed)
Periods are lighter, and less.  Only slight cramping with cycle.  No other symptoms to report other that being tired. ? If she still needs to take FE.

## 2013-03-29 ENCOUNTER — Telehealth: Payer: Self-pay | Admitting: Radiology

## 2013-03-29 NOTE — Telephone Encounter (Signed)
3 mo f/u [place].  Patient states that she is doing well.  Sx are improving, losing weight.  Overall doing well.  States that she will schedule office visit for 6 month follow up late Dec 2014/ early Jan 2015.  Nusrat Encarnacion Carmell [place], [place] 03/29/2013 8:58 AM

## 2013-05-05 ENCOUNTER — Other Ambulatory Visit: Payer: Self-pay

## 2013-06-02 ENCOUNTER — Telehealth: Payer: Self-pay | Admitting: *Deleted

## 2013-06-02 NOTE — Telephone Encounter (Signed)
?   Ok to refill, last refill on Phentermine 73.5 11/19/12 with 2 refills last ov 11/19/12, HCTZ 25mg  last ov 11/19/12 called pt left message due for office visit, awaiting her call back.

## 2013-06-03 MED ORDER — HYDROCHLOROTHIAZIDE 25 MG PO TABS: 25.0000 mg | ORAL_TABLET | Freq: Every morning | ORAL | Status: DC

## 2013-06-03 NOTE — Telephone Encounter (Signed)
Give 30 day on HCTZ, needs OV before any further meds, no refills on Phentermine until seen

## 2013-06-03 NOTE — Telephone Encounter (Signed)
HCTZ refilled and pt aware to make appt for further refills

## 2013-06-15 ENCOUNTER — Ambulatory Visit: Payer: Self-pay | Admitting: Family Medicine

## 2013-06-15 ENCOUNTER — Other Ambulatory Visit (HOSPITAL_COMMUNITY): Payer: Self-pay | Admitting: Diagnostic Radiology

## 2013-06-15 DIAGNOSIS — D219 Benign neoplasm of connective and other soft tissue, unspecified: Secondary | ICD-10-CM

## 2013-06-21 ENCOUNTER — Other Ambulatory Visit: Payer: Self-pay | Admitting: Gynecology

## 2013-06-21 DIAGNOSIS — N920 Excessive and frequent menstruation with regular cycle: Secondary | ICD-10-CM

## 2013-06-21 DIAGNOSIS — D259 Leiomyoma of uterus, unspecified: Secondary | ICD-10-CM

## 2013-06-28 ENCOUNTER — Ambulatory Visit: Payer: Self-pay | Admitting: Family Medicine

## 2013-07-13 ENCOUNTER — Ambulatory Visit: Payer: Self-pay | Admitting: Family Medicine

## 2013-07-20 ENCOUNTER — Ambulatory Visit (INDEPENDENT_AMBULATORY_CARE_PROVIDER_SITE_OTHER): Payer: BC Managed Care – PPO | Admitting: Family Medicine

## 2013-07-20 ENCOUNTER — Encounter: Payer: Self-pay | Admitting: Family Medicine

## 2013-07-20 ENCOUNTER — Other Ambulatory Visit: Payer: Self-pay | Admitting: Family Medicine

## 2013-07-20 VITALS — BP 130/81 | HR 78 | Temp 98.1°F | Resp 18 | Ht 64.5 in | Wt 281.0 lb

## 2013-07-20 DIAGNOSIS — D649 Anemia, unspecified: Secondary | ICD-10-CM | POA: Insufficient documentation

## 2013-07-20 DIAGNOSIS — D509 Iron deficiency anemia, unspecified: Secondary | ICD-10-CM

## 2013-07-20 DIAGNOSIS — E669 Obesity, unspecified: Secondary | ICD-10-CM

## 2013-07-20 DIAGNOSIS — Z139 Encounter for screening, unspecified: Secondary | ICD-10-CM

## 2013-07-20 DIAGNOSIS — R7302 Impaired glucose tolerance (oral): Secondary | ICD-10-CM

## 2013-07-20 DIAGNOSIS — R7309 Other abnormal glucose: Secondary | ICD-10-CM

## 2013-07-20 DIAGNOSIS — I10 Essential (primary) hypertension: Secondary | ICD-10-CM

## 2013-07-20 LAB — CBC WITH DIFFERENTIAL/PLATELET
Basophils Absolute: 0 10*3/uL (ref 0.0–0.1)
Basophils Relative: 0 % (ref 0–1)
Eosinophils Absolute: 0.2 10*3/uL (ref 0.0–0.7)
Eosinophils Relative: 2 % (ref 0–5)
HCT: 29.1 % — ABNORMAL LOW (ref 36.0–46.0)
Hemoglobin: 9.3 g/dL — ABNORMAL LOW (ref 12.0–15.0)
Lymphocytes Relative: 28 % (ref 12–46)
Lymphs Abs: 2.5 10*3/uL (ref 0.7–4.0)
MCH: 18.8 pg — ABNORMAL LOW (ref 26.0–34.0)
MCHC: 32 g/dL (ref 30.0–36.0)
MCV: 58.9 fL — ABNORMAL LOW (ref 78.0–100.0)
Monocytes Absolute: 0.4 10*3/uL (ref 0.1–1.0)
Monocytes Relative: 5 % (ref 3–12)
Neutro Abs: 5.6 10*3/uL (ref 1.7–7.7)
Neutrophils Relative %: 65 % (ref 43–77)
Platelets: 409 10*3/uL — ABNORMAL HIGH (ref 150–400)
RBC: 4.94 MIL/uL (ref 3.87–5.11)
RDW: 18.8 % — ABNORMAL HIGH (ref 11.5–15.5)
WBC: 8.6 10*3/uL (ref 4.0–10.5)

## 2013-07-20 LAB — COMPREHENSIVE METABOLIC PANEL
ALT: 17 U/L (ref 0–35)
AST: 11 U/L (ref 0–37)
Albumin: 3.6 g/dL (ref 3.5–5.2)
Alkaline Phosphatase: 97 U/L (ref 39–117)
BUN: 16 mg/dL (ref 6–23)
CO2: 26 mEq/L (ref 19–32)
Calcium: 8.8 mg/dL (ref 8.4–10.5)
Chloride: 103 mEq/L (ref 96–112)
Creat: 0.57 mg/dL (ref 0.50–1.10)
Glucose, Bld: 90 mg/dL (ref 70–99)
Potassium: 4 mEq/L (ref 3.5–5.3)
Sodium: 136 mEq/L (ref 135–145)
Total Bilirubin: 0.2 mg/dL — ABNORMAL LOW (ref 0.3–1.2)
Total Protein: 6.6 g/dL (ref 6.0–8.3)

## 2013-07-20 LAB — IRON: Iron: 28 ug/dL — ABNORMAL LOW (ref 42–145)

## 2013-07-20 LAB — HEMOGLOBIN A1C
Hgb A1c MFr Bld: 5.2 % (ref <5.7)
Mean Plasma Glucose: 103 mg/dL (ref <117)

## 2013-07-20 MED ORDER — PHENTERMINE HCL 37.5 MG PO TABS
37.5000 mg | ORAL_TABLET | Freq: Every day | ORAL | Status: DC
Start: 2013-07-20 — End: 2013-09-16

## 2013-07-20 MED ORDER — HYDROCHLOROTHIAZIDE 25 MG PO TABS
25.0000 mg | ORAL_TABLET | Freq: Every morning | ORAL | Status: DC
Start: 2013-07-20 — End: 2013-12-27

## 2013-07-20 NOTE — Assessment & Plan Note (Signed)
Restart phentermine, obese with comorbidites of HTN, glucose intolerance

## 2013-07-20 NOTE — Assessment & Plan Note (Signed)
Check A1C 

## 2013-07-20 NOTE — Assessment & Plan Note (Signed)
Continue HCTZ

## 2013-07-20 NOTE — Progress Notes (Signed)
   Subjective:    Patient ID: Elizabeth Harper, female    DOB: 1970-09-14, 43 y.o.   MRN: [id number]  HPI History of followup medications. She's been taking her blood pressure medication as prescribed she has no difficulties with medications. She would like to restart phentermine for her weight loss. She had lost about 20 pounds after being on the medication for about 3 months. She did stop the medication after she went in for her GYN surgery/ablation for her heavy menses. She is due for lab work including CBC and iron level secondary to her anemia associated with her heavy bleeding.    Review of Systems  GEN- denies fatigue, fever, weight loss,weakness, recent illness HEENT- denies eye drainage, change in vision, nasal discharge, CVS- denies chest pain, palpitations RESP- denies SOB, cough, wheeze ABD- denies N/V, change in stools, abd pain GU- denies dysuria, hematuria, dribbling, incontinence MSK- denies joint pain, muscle aches, injury Neuro- denies headache, dizziness, syncope, seizure activity      Objective:   Physical Exam  GEN- NAD, alert and oriented x3 HEENT- PERRL, EOMI, non injected sclera, pink conjunctiva, MMM, oropharynx clear Neck- Supple, no thryomegaly CVS- RRR, no murmur RESP-CTAB EXT- No edema Pulses- Radial, DP- 2+        Assessment & Plan:

## 2013-07-20 NOTE — Patient Instructions (Signed)
Continue HCTZ Phentermine restarted F/U 2 months for Physical

## 2013-07-20 NOTE — Assessment & Plan Note (Signed)
Recheck Hb and iron levels

## 2013-07-21 ENCOUNTER — Ambulatory Visit (HOSPITAL_COMMUNITY)
Admission: RE | Admit: 2013-07-21 | Discharge: 2013-07-21 | Disposition: A | Discharge status: Home / Self Care | Payer: BC Managed Care – PPO | Source: Ambulatory Visit | Attending: Family Medicine | Admitting: Family Medicine

## 2013-07-21 ENCOUNTER — Telehealth: Payer: Self-pay | Admitting: *Deleted

## 2013-07-21 DIAGNOSIS — Z1231 Encounter for screening mammogram for malignant neoplasm of breast: Secondary | ICD-10-CM | POA: Insufficient documentation

## 2013-07-21 DIAGNOSIS — Z139 Encounter for screening, unspecified: Secondary | ICD-10-CM

## 2013-07-21 MED ORDER — FERROUS SULFATE 325 (65 FE) MG PO TABS: 325.0000 mg | ORAL_TABLET | Freq: Three times a day (TID) | ORAL | Status: DC

## 2013-07-21 NOTE — Addendum Note (Signed)
Addended by: Vic Blackbird F on: 07/21/2013 01:29 PM   Modules accepted: Orders

## 2013-07-22 NOTE — Telephone Encounter (Signed)
Pt is wanting to know if something could be called in for a poss uti and she is wanting to know her lab results Call back number is [phone]

## 2013-07-22 NOTE — Telephone Encounter (Signed)
LMTRC

## 2013-07-25 NOTE — Telephone Encounter (Signed)
LMTRC

## 2013-07-26 NOTE — Telephone Encounter (Signed)
LMTRC

## 2013-08-03 ENCOUNTER — Other Ambulatory Visit: Payer: BC Managed Care – PPO

## 2013-08-03 ENCOUNTER — Ambulatory Visit
Admission: RE | Admit: 2013-08-03 | Discharge: 2013-08-03 | Disposition: A | Discharge status: Home / Self Care | Payer: BC Managed Care – PPO | Source: Ambulatory Visit | Attending: Diagnostic Radiology | Admitting: Diagnostic Radiology

## 2013-08-03 ENCOUNTER — Ambulatory Visit
Admission: RE | Admit: 2013-08-03 | Discharge: 2013-08-03 | Disposition: A | Discharge status: Home / Self Care | Payer: BC Managed Care – PPO | Source: Ambulatory Visit | Attending: Gynecology | Admitting: Gynecology

## 2013-08-03 VITALS — BP 150/74 | HR 105 | Temp 98.2°F | Resp 18

## 2013-08-03 DIAGNOSIS — D219 Benign neoplasm of connective and other soft tissue, unspecified: Secondary | ICD-10-CM

## 2013-08-03 DIAGNOSIS — D259 Leiomyoma of uterus, unspecified: Secondary | ICD-10-CM

## 2013-08-03 DIAGNOSIS — N920 Excessive and frequent menstruation with regular cycle: Secondary | ICD-10-CM

## 2013-08-03 MED ORDER — GADOBENATE DIMEGLUMINE 529 MG/ML IV SOLN
20.0000 mL | Freq: Once | INTRAVENOUS | Status: AC | PRN
  Administered 2013-08-03: 20 mL via INTRAVENOUS

## 2013-08-03 NOTE — Progress Notes (Signed)
30MO POST [place] ///PT EXPERIENCES LIGHTER PERIORDS AND ARE SHORTER ABOUT 4-5 DAYS,. SOME ? C/C OF RECTAL PRESSURE.

## 2013-09-16 ENCOUNTER — Encounter: Payer: Self-pay | Admitting: Family Medicine

## 2013-09-16 ENCOUNTER — Ambulatory Visit (INDEPENDENT_AMBULATORY_CARE_PROVIDER_SITE_OTHER): Payer: BC Managed Care – PPO | Admitting: Family Medicine

## 2013-09-16 VITALS — BP 136/82 | HR 88 | Temp 98.1°F | Resp 18 | Ht 64.5 in | Wt 278.0 lb

## 2013-09-16 DIAGNOSIS — R7302 Impaired glucose tolerance (oral): Secondary | ICD-10-CM

## 2013-09-16 DIAGNOSIS — E669 Obesity, unspecified: Secondary | ICD-10-CM

## 2013-09-16 DIAGNOSIS — R7309 Other abnormal glucose: Secondary | ICD-10-CM

## 2013-09-16 DIAGNOSIS — S46912A Strain of unspecified muscle, fascia and tendon at shoulder and upper arm level, left arm, initial encounter: Secondary | ICD-10-CM

## 2013-09-16 DIAGNOSIS — Z0000 Encounter for general adult medical examination without abnormal findings: Secondary | ICD-10-CM

## 2013-09-16 DIAGNOSIS — Z13220 Encounter for screening for lipoid disorders: Secondary | ICD-10-CM

## 2013-09-16 DIAGNOSIS — Z124 Encounter for screening for malignant neoplasm of cervix: Secondary | ICD-10-CM

## 2013-09-16 DIAGNOSIS — IMO0002 Reserved for concepts with insufficient information to code with codable children: Secondary | ICD-10-CM

## 2013-09-16 MED ORDER — NAPROXEN 500 MG PO TABS: 500.0000 mg | ORAL_TABLET | Freq: Two times a day (BID) | ORAL | Status: DC

## 2013-09-16 MED ORDER — PHENTERMINE HCL 37.5 MG PO TABS: 37.5000 mg | ORAL_TABLET | Freq: Every day | ORAL | Status: DC

## 2013-09-16 NOTE — Patient Instructions (Addendum)
I recommend eye visit once a year I recommend dental visit every 6 months Goal is to  Exercise 30 minutes 5 days a week We will send a letter with lab results  Naprosyn and ICE for shoulder Referral to nutritionist Get lipid done fasting F/U in 3 months

## 2013-09-18 ENCOUNTER — Encounter: Payer: Self-pay | Admitting: Family Medicine

## 2013-09-18 DIAGNOSIS — Z0000 Encounter for general adult medical examination without abnormal findings: Secondary | ICD-10-CM

## 2013-09-18 DIAGNOSIS — S46912A Strain of unspecified muscle, fascia and tendon at shoulder and upper arm level, left arm, initial encounter: Secondary | ICD-10-CM | POA: Insufficient documentation

## 2013-09-18 HISTORY — DX: Encounter for general adult medical examination without abnormal findings: Z00.00

## 2013-09-18 NOTE — Assessment & Plan Note (Signed)
Continue with phentermine she needs improvement on her diet, refer to nutritionist at her job for assistance,

## 2013-09-18 NOTE — Assessment & Plan Note (Signed)
ICE, NSAIDS, work on ROM at home to prevent stiffness, no imaging needed based on exam

## 2013-09-18 NOTE — Assessment & Plan Note (Signed)
PAP Smear done, immunizations UTD Mammo UTD
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
## 2013-09-19 LAB — PAP THINPREP ASCUS RFLX HPV RFLX TYPE

## 2013-09-20 ENCOUNTER — Telehealth (HOSPITAL_COMMUNITY): Payer: Self-pay | Admitting: Dietician

## 2013-09-20 ENCOUNTER — Encounter: Payer: Self-pay | Admitting: *Deleted

## 2013-09-20 NOTE — Telephone Encounter (Signed)
Sent letter to pt home via US Mail in attempt to contact pt to schedule appointment.  

## 2013-09-20 NOTE — Telephone Encounter (Signed)
Received consult via fax from [facility] for dx: obesity.

## 2013-09-27 NOTE — Telephone Encounter (Signed)
No response from previous contact attempt. Sent letter to pt home via US Mail in attempt to contact pt to schedule appointment.  

## 2013-10-04 NOTE — Telephone Encounter (Signed)
Pt has not responded to attempts to contact to schedule appointment. Referral filed.  

## 2013-10-04 NOTE — Telephone Encounter (Signed)
Received voicemail from [place] at [facility] on 10/03/13 at 1307. Called back x 3 at 1337. Office is closed for lunch and unable to successfully leave voicemail message. Message reports "invalid extension".

## 2013-10-10 ENCOUNTER — Telehealth (HOSPITAL_COMMUNITY): Payer: Self-pay | Admitting: Dietician

## 2013-10-10 NOTE — Telephone Encounter (Signed)
Received multiple voicemail from [place] at [facility] requesting status of referral. Called back at 1625 and informed that appointment has not been set up as pt has not responded to contact attempts.

## 2013-12-19 ENCOUNTER — Ambulatory Visit: Payer: BC Managed Care – PPO | Admitting: Family Medicine

## 2013-12-21 ENCOUNTER — Encounter: Payer: Self-pay | Admitting: Family Medicine

## 2013-12-21 ENCOUNTER — Other Ambulatory Visit: Payer: Self-pay | Admitting: Family Medicine

## 2013-12-21 DIAGNOSIS — Z79899 Other long term (current) drug therapy: Secondary | ICD-10-CM

## 2013-12-21 DIAGNOSIS — E559 Vitamin D deficiency, unspecified: Secondary | ICD-10-CM

## 2013-12-21 DIAGNOSIS — Z0000 Encounter for general adult medical examination without abnormal findings: Secondary | ICD-10-CM

## 2013-12-21 DIAGNOSIS — I10 Essential (primary) hypertension: Secondary | ICD-10-CM

## 2013-12-21 DIAGNOSIS — D509 Iron deficiency anemia, unspecified: Secondary | ICD-10-CM

## 2013-12-21 LAB — CBC
HCT: 33.2 % — ABNORMAL LOW (ref 36.0–46.0)
Hemoglobin: 10.7 g/dL — ABNORMAL LOW (ref 12.0–15.0)
MCH: 20.5 pg — ABNORMAL LOW (ref 26.0–34.0)
MCHC: 32.2 g/dL (ref 30.0–36.0)
MCV: 63.7 fL — ABNORMAL LOW (ref 78.0–100.0)
Platelets: 337 10*3/uL (ref 150–400)
RBC: 5.21 MIL/uL — ABNORMAL HIGH (ref 3.87–5.11)
RDW: 16.8 % — ABNORMAL HIGH (ref 11.5–15.5)
WBC: 7.6 10*3/uL (ref 4.0–10.5)

## 2013-12-21 LAB — COMPLETE METABOLIC PANEL WITH GFR
ALT: 17 U/L (ref 0–35)
AST: 12 U/L (ref 0–37)
Albumin: 3.7 g/dL (ref 3.5–5.2)
Alkaline Phosphatase: 97 U/L (ref 39–117)
BUN: 11 mg/dL (ref 6–23)
CO2: 29 mEq/L (ref 19–32)
Calcium: 9.4 mg/dL (ref 8.4–10.5)
Chloride: 100 mEq/L (ref 96–112)
Creat: 0.65 mg/dL (ref 0.50–1.10)
GFR, Est African American: >89 mL/min
GFR, Est Non African American: >89 mL/min
Glucose, Bld: 89 mg/dL (ref 70–99)
Potassium: 3.9 mEq/L (ref 3.5–5.3)
Sodium: 137 mEq/L (ref 135–145)
Total Bilirubin: 0.2 mg/dL (ref 0.2–1.2)
Total Protein: 6.9 g/dL (ref 6.0–8.3)

## 2013-12-21 LAB — LIPID PANEL
Cholesterol: 170 mg/dL (ref 0–200)
HDL: 43 mg/dL (ref >39)
LDL Cholesterol: 107 mg/dL — ABNORMAL HIGH (ref 0–99)
Total CHOL/HDL Ratio: 4 Ratio
Triglycerides: 102 mg/dL (ref <150)
VLDL: 20 mg/dL (ref 0–40)

## 2013-12-21 LAB — IRON: Iron: 49 ug/dL (ref 42–145)

## 2013-12-21 LAB — HEMOGLOBIN A1C
Hgb A1c MFr Bld: 6.1 % — ABNORMAL HIGH (ref <5.7)
Mean Plasma Glucose: 128 mg/dL — ABNORMAL HIGH (ref <117)

## 2013-12-21 NOTE — Progress Notes (Signed)
Patient ID: Elizabeth Harper, female   DOB: 1971-03-13, 43 y.o.   MRN: [id number] Pt called stating she is needing her lab work sent to [facility] lab where she was at another facility, stated she was here for a physical and wasn't fasting so could not do labs at that time. Today she is at [facility] lab to have her BW done and needed orders put in. Told pt to give me 5 mins and the orders will be in and I would release them so that they can be seen and drawn. Orders was placed and released.

## 2013-12-22 LAB — VITAMIN D 25 HYDROXY (VIT D DEFICIENCY, FRACTURES): Vit D, 25-Hydroxy: 40 ng/mL (ref 30–89)

## 2013-12-26 ENCOUNTER — Telehealth: Payer: Self-pay | Admitting: *Deleted

## 2013-12-26 NOTE — Telephone Encounter (Signed)
Refills on HCTZ and Imogene   If questions please call.

## 2013-12-27 MED ORDER — HYDROCHLOROTHIAZIDE 25 MG PO TABS: 25.0000 mg | ORAL_TABLET | Freq: Every morning | ORAL | Status: DC

## 2013-12-27 NOTE — Telephone Encounter (Signed)
Patient has had 3 month supply of Apidex (09/16/2013 with #2 refills).   MD please advise.

## 2013-12-27 NOTE — Telephone Encounter (Signed)
HCTZ refilled.   Call placed to patient to make aware. [place]oyle.

## 2013-12-27 NOTE — Telephone Encounter (Signed)
Ok with hctz, I will not rx adipex greater than 3 months.

## 2014-01-12 ENCOUNTER — Telehealth: Payer: Self-pay | Admitting: Family Medicine

## 2014-01-13 NOTE — Telephone Encounter (Signed)
ERROR

## 2014-02-09 ENCOUNTER — Ambulatory Visit: Payer: BC Managed Care – PPO | Admitting: Family Medicine

## 2014-03-03 ENCOUNTER — Encounter: Payer: Self-pay | Admitting: Family Medicine

## 2014-03-03 ENCOUNTER — Ambulatory Visit (INDEPENDENT_AMBULATORY_CARE_PROVIDER_SITE_OTHER): Payer: BC Managed Care – PPO | Admitting: Family Medicine

## 2014-03-03 VITALS — BP 128/74 | HR 78 | Temp 97.5°F | Resp 14 | Ht 65.0 in | Wt 288.0 lb

## 2014-03-03 DIAGNOSIS — E669 Obesity, unspecified: Secondary | ICD-10-CM

## 2014-03-03 DIAGNOSIS — R600 Localized edema: Secondary | ICD-10-CM | POA: Insufficient documentation

## 2014-03-03 DIAGNOSIS — R7302 Impaired glucose tolerance (oral): Secondary | ICD-10-CM

## 2014-03-03 DIAGNOSIS — I10 Essential (primary) hypertension: Secondary | ICD-10-CM

## 2014-03-03 DIAGNOSIS — R7309 Other abnormal glucose: Secondary | ICD-10-CM

## 2014-03-03 DIAGNOSIS — R609 Edema, unspecified: Secondary | ICD-10-CM

## 2014-03-03 DIAGNOSIS — R0789 Other chest pain: Secondary | ICD-10-CM

## 2014-03-03 MED ORDER — NAPROXEN 500 MG PO TABS: 500.0000 mg | ORAL_TABLET | Freq: Two times a day (BID) | ORAL | Status: DC

## 2014-03-03 MED ORDER — FUROSEMIDE 40 MG PO TABS: 40.0000 mg | ORAL_TABLET | Freq: Every day | ORAL | Status: DC | PRN

## 2014-03-03 NOTE — Progress Notes (Signed)
Patient ID: Elizabeth Harper, female   DOB: Feb 15, 1971, 43 y.o.   MRN: [id number]   Subjective:    Patient ID: Elizabeth Harper, female    DOB: April 25, 1971, 43 y.o.   MRN: [id number]  Patient presents for BLE Edema Past couple of months has had 2 episodes of burning sensation in left side of chest, last a few minutes, typically occurs while brusing her teeth, non radiating, no nausea, no SOB, no radiation of pain. Denies having heartburn typically.  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 Takes HCTZ which helps, she does not like to wear compression hose.  Obesity- has gained 10lbs since our last, visit, she had stopped working out and was not watching her diet, still gets stiffness only in her right knee on and off    Review Of Systems:  GEN- denies fatigue, fever, weight loss,weakness, recent illness HEENT- denies eye drainage, change in vision, nasal discharge, CVS- denies chest pain, palpitations RESP- denies SOB, cough, wheeze ABD- denies N/V, change in stools, abd pain GU- denies dysuria, hematuria, dribbling, incontinence MSK- +joint pain, muscle aches, injury Neuro- denies headache, dizziness, syncope, seizure activity       Objective:    BP 128/74  Pulse 78  Temp(Src) 97.5 F (36.4 C) (Oral)  Resp 14  Ht 5\' 5"  (1.651 m)  Wt 288 lb (130.636 kg)  BMI 47.93 kg/m2  LMP 02/25/2014 GEN- NAD, alert and oriented x3 HEENT- PERRL, EOMI, non injected sclera, pink conjunctiva, MMM, oropharynx clear Neck- Supple, no thyromegaly, no JVD CVS- RRR, no murmur RESP-CTAB ABD-NABS,soft,NT,ND EXT-trace pedal edema Pulses- Radial, DP- 2+  EKG- NSR, no ST changes        Assessment & Plan:      Problem List Items Addressed This Visit   None      Note: This dictation was prepared with Dragon dictation along with smaller phrase technology. Any transcriptional errors that result from this process  are unintentional.

## 2014-03-03 NOTE — Assessment & Plan Note (Signed)
Chest pain sounds more GI related Advised trial of TUMS or zantac EKG unremarkable No signs of overt CHF

## 2014-03-03 NOTE — Assessment & Plan Note (Signed)
Plan to recheck A1C in 3 weeks

## 2014-03-03 NOTE — Patient Instructions (Signed)
Get the labs done fasting after 03/23/14 for insurance purpose Take lasix 40mg  daily for 3 days to see if this helps with the fluid, then go back to HCTZ, if you find you need lasix more let me know COntinue to work on diet and exercise F/U 4 months

## 2014-03-03 NOTE — Assessment & Plan Note (Signed)
I think this is weight related and with standing on her feet Doubt CHF based on exam, however will obtain a BNP with her labs Lasix 40mg  x 3 days, then resume HCTZ and we will see how she does Declines compression hose

## 2014-03-03 NOTE — Assessment & Plan Note (Signed)
Well controlled will plan to continue HCTZ see below

## 2014-03-03 NOTE — Assessment & Plan Note (Signed)
Discussed importance of weight loss, diet adherence Short term goals set

## 2014-04-14 ENCOUNTER — Other Ambulatory Visit: Payer: Self-pay

## 2015-01-31 ENCOUNTER — Other Ambulatory Visit: Payer: Self-pay | Admitting: Family Medicine

## 2015-01-31 NOTE — Telephone Encounter (Signed)
Medication refilled per protocol. 

## 2015-02-14 ENCOUNTER — Encounter: Payer: Self-pay | Admitting: Family Medicine

## 2015-02-14 ENCOUNTER — Ambulatory Visit (INDEPENDENT_AMBULATORY_CARE_PROVIDER_SITE_OTHER): Payer: BLUE CROSS/BLUE SHIELD | Admitting: Family Medicine

## 2015-02-14 VITALS — BP 132/72 | HR 76 | Temp 98.0°F | Resp 14 | Ht 65.0 in | Wt 283.0 lb

## 2015-02-14 DIAGNOSIS — Z0000 Encounter for general adult medical examination without abnormal findings: Secondary | ICD-10-CM | POA: Diagnosis not present

## 2015-02-14 DIAGNOSIS — Z202 Contact with and (suspected) exposure to infections with a predominantly sexual mode of transmission: Secondary | ICD-10-CM

## 2015-02-14 DIAGNOSIS — E559 Vitamin D deficiency, unspecified: Secondary | ICD-10-CM | POA: Diagnosis not present

## 2015-02-14 DIAGNOSIS — I10 Essential (primary) hypertension: Secondary | ICD-10-CM

## 2015-02-14 DIAGNOSIS — E669 Obesity, unspecified: Secondary | ICD-10-CM

## 2015-02-14 DIAGNOSIS — D251 Intramural leiomyoma of uterus: Secondary | ICD-10-CM | POA: Diagnosis not present

## 2015-02-14 DIAGNOSIS — Z124 Encounter for screening for malignant neoplasm of cervix: Secondary | ICD-10-CM

## 2015-02-14 DIAGNOSIS — Z1239 Encounter for other screening for malignant neoplasm of breast: Secondary | ICD-10-CM | POA: Diagnosis not present

## 2015-02-14 LAB — WET PREP FOR TRICH, YEAST, CLUE
Trich, Wet Prep: NONE SEEN
Yeast Wet Prep HPF POC: NONE SEEN

## 2015-02-14 MED ORDER — HYDROCHLOROTHIAZIDE 25 MG PO TABS: ORAL_TABLET | ORAL | Status: DC

## 2015-02-14 NOTE — Patient Instructions (Signed)
Schedule your mammogram- your breast exam [place] to be set up to recheck fibroids We will call with lab results [facility]- [phone]  F/U 6 months

## 2015-02-14 NOTE — Assessment & Plan Note (Signed)
CPE done, STD check. pt to schedule mammogram, no mass palpated but dense breast, also has very large DDD breast size with indentations in shoulder she would benefit from breast reduction. Immunizations are up-to-date. I've given her information for the travel clinic as well. We will obtain ultrasound to reevaluate her years with regards to the fibroids. She will continue her current blood pressure medication. Fasting labs done today. Continue to work on weight loss.

## 2015-02-14 NOTE — Assessment & Plan Note (Signed)
Blood pressure well controlled on hydrochlorothiazide

## 2015-02-14 NOTE — Progress Notes (Signed)
Patient ID: Elizabeth Harper, female   DOB: 12-07-1970, 44 y.o.   MRN: [id number]   Subjective:    Patient ID: Elizabeth Harper, female    DOB: 1971/03/31, 44 y.o.   MRN: [id number]  Patient presents for CPE with PAP  patient here for complete physical exam. She is due for Pap smear she has history of fibroid uterus due to some abnormalities of Pap smear has been getting yearly Pap smears. She did have embolization done about 2 years ago for her fibroids and is due for follow-up ultrasound on this. She tells me things have been a little stressful over the past 6 months. She is now divorced from her husband she is still living at home is still working full-time. Both of her children are in college. In general she thinks that she is doing well and things are now starting to settle out. She does have support from some other family members. She is not concentrating on losing weight very much but now that she is divorced feels like she needs to spend more time on her own health. She is due for fasting labs today. She also tells me that she is going to [place] for a trip and will need to be seen by the travel clinic for vaccinations.   Due to her divorce she would like to be tested for all STDs  Due for mammogram  LMP 1 week ago   Review Of Systems:  GEN- denies fatigue, fever, weight loss,weakness, recent illness HEENT- denies eye drainage, change in vision, nasal discharge, CVS- denies chest pain, palpitations RESP- denies SOB, cough, wheeze ABD- denies N/V, change in stools, abd pain GU- denies dysuria, hematuria, dribbling, incontinence MSK- denies joint pain, muscle aches, injury Neuro- denies headache, dizziness, syncope, seizure activity       Objective:    BP 132/72 mmHg  Pulse 76  Temp(Src) 98 F (36.7 C) (Oral)  Resp 14  Ht 5\' 5"  (1.651 m)  Wt 283 lb (128.368 kg)  BMI 47.09 kg/m2  LMP 02/11/2015 (Approximate) GEN- NAD, alert and oriented x3 HEENT- PERRL, EOMI, non injected  sclera, pink conjunctiva, MMM, oropharynx clear Neck- Supple, no thyromegaly, good ROM Breast- normal symmetry, no nipple inversion,no nipple drainage, no nodules , fibrous tissue felt Nodes- no axillary nodes CVS- RRR, no murmur RESP-CTAB ABD-NABS,soft,NT,ND Psych- normal affect and mood GU- normal external genitalia, vaginal mucosa pink and moist, cervix visualized no growth,+blood form os, minimal thin clear discharge, no CMT, no ovarian masses, uterus enlarged EXT- No edema      Assessment & Plan:      Problem List Items Addressed This Visit    Vitamin D deficiency   Relevant Orders   Vitamin D, 25-hydroxy   Routine general medical examination at a health care facility   Relevant Orders   CBC with Differential/Platelet   Comprehensive metabolic panel   Lipid panel   Obesity   Relevant Orders   Lipid panel   Fibroid uterus   Relevant Orders   US Pelvis Complete   US Transvaginal Non-OB   Essential hypertension, benign - Primary   Relevant Medications   hydrochlorothiazide (HYDRODIURIL) 25 MG tablet   Other Relevant Orders   TSH    Other Visit Diagnoses    Possible exposure to STD        Relevant Orders    HIV antibody    RPR    Acute Hep Panel & Hep B Surface Ab    HSV(herpes smplx)abs-1+2(IgG+IgM)-bld  GC/Chlamydia Probe Amp    WET PREP FOR TRICH, YEAST, CLUE (Completed)    Cervical cancer screening        Relevant Orders    PAP, ThinPrep ASCUS Rflx HPV Rflx Type    Breast cancer screening        Relevant Orders    MM SCREENING BREAST TOMO BILATERAL       Note: This dictation was prepared with Dragon dictation along with smaller phrase technology. Any transcriptional errors that result from this process are unintentional.

## 2015-02-15 LAB — CBC WITH DIFFERENTIAL/PLATELET
Basophils Absolute: 0 10*3/uL (ref 0.0–0.1)
Basophils Relative: 0 % (ref 0–1)
Eosinophils Absolute: 0.1 10*3/uL (ref 0.0–0.7)
Eosinophils Relative: 1 % (ref 0–5)
HCT: 31.3 % — ABNORMAL LOW (ref 36.0–46.0)
Hemoglobin: 9.7 g/dL — ABNORMAL LOW (ref 12.0–15.0)
Lymphocytes Relative: 22 % (ref 12–46)
Lymphs Abs: 2 10*3/uL (ref 0.7–4.0)
MCH: 19.9 pg — ABNORMAL LOW (ref 26.0–34.0)
MCHC: 31 g/dL (ref 30.0–36.0)
MCV: 64.1 fL — ABNORMAL LOW (ref 78.0–100.0)
MPV: 9.5 fL (ref 8.6–12.4)
Monocytes Absolute: 0.3 10*3/uL (ref 0.1–1.0)
Monocytes Relative: 3 % (ref 3–12)
Neutro Abs: 6.8 10*3/uL (ref 1.7–7.7)
Neutrophils Relative %: 74 % (ref 43–77)
Platelets: 396 10*3/uL (ref 150–400)
RBC: 4.88 MIL/uL (ref 3.87–5.11)
RDW: 17.3 % — ABNORMAL HIGH (ref 11.5–15.5)
WBC: 9.2 10*3/uL (ref 4.0–10.5)

## 2015-02-15 LAB — HSV(HERPES SMPLX)ABS-I+II(IGG+IGM)-BLD
HSV 1 Glycoprotein G Ab, IgG: <0.1 IV
HSV 2 Glycoprotein G Ab, IgG: <0.1 IV
Herpes Simplex Vrs I&II-IgM Ab (EIA): 1.07 INDEX

## 2015-02-15 LAB — COMPREHENSIVE METABOLIC PANEL
ALT: 16 U/L (ref 6–29)
AST: 8 U/L — ABNORMAL LOW (ref 10–30)
Albumin: 3.7 g/dL (ref 3.6–5.1)
Alkaline Phosphatase: 94 U/L (ref 33–115)
BUN: 13 mg/dL (ref 7–25)
CO2: 25 mmol/L (ref 20–31)
Calcium: 9.2 mg/dL (ref 8.6–10.2)
Chloride: 102 mmol/L (ref 98–110)
Creat: 0.61 mg/dL (ref 0.50–1.10)
Glucose, Bld: 79 mg/dL (ref 70–99)
Potassium: 3.4 mmol/L — ABNORMAL LOW (ref 3.5–5.3)
Sodium: 138 mmol/L (ref 135–146)
Total Bilirubin: 0.4 mg/dL (ref 0.2–1.2)
Total Protein: 6.7 g/dL (ref 6.1–8.1)

## 2015-02-15 LAB — PAP THINPREP ASCUS RFLX HPV RFLX TYPE

## 2015-02-15 LAB — VITAMIN D 25 HYDROXY (VIT D DEFICIENCY, FRACTURES): Vit D, 25-Hydroxy: 26 ng/mL — ABNORMAL LOW (ref 30–100)

## 2015-02-15 LAB — LIPID PANEL
Cholesterol: 169 mg/dL (ref 125–200)
HDL: 43 mg/dL — ABNORMAL LOW (ref >=46)
LDL Cholesterol: 113 mg/dL (ref <130)
Total CHOL/HDL Ratio: 3.9 Ratio (ref <=5.0)
Triglycerides: 64 mg/dL (ref <150)
VLDL: 13 mg/dL (ref <30)

## 2015-02-15 LAB — GC/CHLAMYDIA PROBE AMP
CT Probe RNA: NEGATIVE
GC Probe RNA: NEGATIVE

## 2015-02-15 LAB — TSH: TSH: 1.059 u[IU]/mL (ref 0.350–4.500)

## 2015-02-15 LAB — ACUTE HEP PANEL AND HEP B SURFACE AB
HCV Ab: NEGATIVE
Hep A IgM: NONREACTIVE
Hep B C IgM: NONREACTIVE
Hep B S Ab: POSITIVE — AB
Hepatitis B Surface Ag: NEGATIVE

## 2015-02-15 LAB — RPR

## 2015-02-15 LAB — HIV ANTIBODY (ROUTINE TESTING W REFLEX): HIV 1&2 Ab, 4th Generation: NONREACTIVE

## 2015-02-16 ENCOUNTER — Telehealth: Payer: Self-pay | Admitting: Family Medicine

## 2015-02-16 NOTE — Telephone Encounter (Signed)
MD has not reviewed at this time.   Will contact patient when results have been reviewed.

## 2015-02-16 NOTE — Telephone Encounter (Signed)
Patient calling about lab results  [phone]

## 2015-02-23 ENCOUNTER — Other Ambulatory Visit (HOSPITAL_COMMUNITY): Payer: Self-pay

## 2015-02-23 ENCOUNTER — Ambulatory Visit (HOSPITAL_COMMUNITY)
Admission: RE | Admit: 2015-02-23 | Discharge: 2015-02-23 | Disposition: A | Discharge status: Home / Self Care | Payer: BLUE CROSS/BLUE SHIELD | Source: Ambulatory Visit | Attending: Family Medicine | Admitting: Family Medicine

## 2015-02-23 DIAGNOSIS — N852 Hypertrophy of uterus: Secondary | ICD-10-CM | POA: Insufficient documentation

## 2015-02-23 DIAGNOSIS — R102 Pelvic and perineal pain: Secondary | ICD-10-CM | POA: Diagnosis not present

## 2015-02-23 DIAGNOSIS — D251 Intramural leiomyoma of uterus: Secondary | ICD-10-CM

## 2015-02-23 DIAGNOSIS — D259 Leiomyoma of uterus, unspecified: Secondary | ICD-10-CM | POA: Diagnosis not present

## 2015-03-01 ENCOUNTER — Ambulatory Visit (HOSPITAL_COMMUNITY)
Admission: RE | Admit: 2015-03-01 | Discharge: 2015-03-01 | Disposition: A | Discharge status: Home / Self Care | Payer: BLUE CROSS/BLUE SHIELD | Source: Ambulatory Visit | Attending: Family Medicine | Admitting: Family Medicine

## 2015-03-01 ENCOUNTER — Other Ambulatory Visit: Payer: Self-pay | Admitting: Family Medicine

## 2015-03-01 ENCOUNTER — Telehealth: Payer: Self-pay | Admitting: Family Medicine

## 2015-03-01 ENCOUNTER — Ambulatory Visit (HOSPITAL_COMMUNITY): Payer: BLUE CROSS/BLUE SHIELD

## 2015-03-01 DIAGNOSIS — Z1231 Encounter for screening mammogram for malignant neoplasm of breast: Secondary | ICD-10-CM | POA: Insufficient documentation

## 2015-03-01 NOTE — Telephone Encounter (Signed)
Order was placed for 3D Mammo.  Pt has $50 co pay she does not want to pay.  Deneise Lever Pen is going to change order to regular mammogram.  Order will follow for you to sign

## 2015-03-02 NOTE — Telephone Encounter (Signed)
Okay to change order, will sign when it comes in chart

## 2015-03-30 ENCOUNTER — Ambulatory Visit: Payer: BLUE CROSS/BLUE SHIELD | Admitting: Family Medicine

## 2015-07-09 ENCOUNTER — Encounter (HOSPITAL_COMMUNITY): Payer: Self-pay

## 2015-07-09 ENCOUNTER — Emergency Department (HOSPITAL_COMMUNITY): Payer: PRIVATE HEALTH INSURANCE

## 2015-07-09 ENCOUNTER — Emergency Department (HOSPITAL_COMMUNITY)
Admission: EM | Admit: 2015-07-09 | Discharge: 2015-07-09 | Disposition: A | Discharge status: Home / Self Care | Payer: PRIVATE HEALTH INSURANCE | Attending: Emergency Medicine | Admitting: Emergency Medicine

## 2015-07-09 DIAGNOSIS — Y9389 Activity, other specified: Secondary | ICD-10-CM | POA: Diagnosis not present

## 2015-07-09 DIAGNOSIS — Z91040 Latex allergy status: Secondary | ICD-10-CM | POA: Diagnosis not present

## 2015-07-09 DIAGNOSIS — S39012A Strain of muscle, fascia and tendon of lower back, initial encounter: Secondary | ICD-10-CM | POA: Insufficient documentation

## 2015-07-09 DIAGNOSIS — Y92481 Parking lot as the place of occurrence of the external cause: Secondary | ICD-10-CM | POA: Insufficient documentation

## 2015-07-09 DIAGNOSIS — S8002XA Contusion of left knee, initial encounter: Secondary | ICD-10-CM | POA: Diagnosis not present

## 2015-07-09 DIAGNOSIS — W1839XA Other fall on same level, initial encounter: Secondary | ICD-10-CM | POA: Insufficient documentation

## 2015-07-09 DIAGNOSIS — I10 Essential (primary) hypertension: Secondary | ICD-10-CM | POA: Insufficient documentation

## 2015-07-09 DIAGNOSIS — Y998 Other external cause status: Secondary | ICD-10-CM | POA: Insufficient documentation

## 2015-07-09 DIAGNOSIS — Z86018 Personal history of other benign neoplasm: Secondary | ICD-10-CM | POA: Insufficient documentation

## 2015-07-09 DIAGNOSIS — S8992XA Unspecified injury of left lower leg, initial encounter: Secondary | ICD-10-CM | POA: Diagnosis present

## 2015-07-09 MED ORDER — ONDANSETRON HCL 4 MG PO TABS
4.0000 mg | ORAL_TABLET | Freq: Once | ORAL | Status: AC
  Administered 2015-07-09: 4 mg via ORAL
  Filled 2015-07-09: qty 1

## 2015-07-09 MED ORDER — MELOXICAM 15 MG PO TABS: 15.0000 mg | ORAL_TABLET | Freq: Every day | ORAL | Status: DC

## 2015-07-09 MED ORDER — HYDROCODONE-ACETAMINOPHEN 5-325 MG PO TABS: 1.0000 | ORAL_TABLET | ORAL | Status: DC | PRN

## 2015-07-09 MED ORDER — KETOROLAC TROMETHAMINE 10 MG PO TABS
10.0000 mg | ORAL_TABLET | Freq: Once | ORAL | Status: AC
  Administered 2015-07-09: 10 mg via ORAL
  Filled 2015-07-09: qty 1

## 2015-07-09 NOTE — ED Provider Notes (Signed)
CSN: [id number]     Arrival date & time 07/09/15  0759 History   First MD Initiated Contact with Patient 07/09/15 0803     No chief complaint on file.    (Consider location/radiation/quality/duration/timing/severity/associated sxs/prior Treatment) HPI Comments: Pt sustained a fall from a standing position. Denies anticoag meds.  No operations or procedures on the left knee or back.  Patient is a 45 y.o. female presenting with fall. The history is provided by the patient.  Fall This is a new problem. The current episode started today. The problem has been unchanged. Associated symptoms include arthralgias. Associated symptoms comments: Left knee and left lower back and side pain. The symptoms are aggravated by standing and walking (movement). She has tried nothing for the symptoms.    Past Medical History  Diagnosis Date  . Hypertension   . Fibroid uterus    Past Surgical History  Procedure Laterality Date  . Uterine artery embolization  12/27/12   Family History  Problem Relation Age of Onset  . Hyperlipidemia Mother   . Hyperlipidemia Father   . Diabetes Father   . Hyperlipidemia Brother   . Cancer Maternal Aunt     Breast cancer  . Cancer Paternal Aunt     Breast Cancer   Social History  Substance Use Topics  . Smoking status: Never Smoker   . Smokeless tobacco: Never Used  . Alcohol Use: No   OB History    No data available     Review of Systems  Musculoskeletal: Positive for back pain and arthralgias.  All other systems reviewed and are negative.     Allergies  Gadolinium derivatives and Latex  Home Medications   Prior to Admission medications   Medication Sig Start Date End Date Taking? Authorizing Provider  furosemide (LASIX) 40 MG tablet Take 1 tablet (40 mg total) by mouth daily as needed. 03/03/14   Alycia Rossetti, MD  hydrochlorothiazide (HYDRODIURIL) 25 MG tablet TAKE 1 TABLET BY MOUTH IN THE MORNING. 02/14/15   Alycia Rossetti, MD   There were  no vitals taken for this visit. Physical Exam  Constitutional: She is oriented to person, place, and time. She appears well-developed and well-nourished.  Non-toxic appearance.  HENT:  Head: Normocephalic.  Right Ear: Tympanic membrane and external ear normal.  Left Ear: Tympanic membrane and external ear normal.  Eyes: EOM and lids are normal. Pupils are equal, round, and reactive to light.  Neck: Normal range of motion. Neck supple. Carotid bruit is not present.  Cardiovascular: Normal rate, regular rhythm, normal heart sounds, intact distal pulses and normal pulses.   Pulmonary/Chest: Breath sounds normal. No respiratory distress.  Abdominal: Soft. Bowel sounds are normal. There is no tenderness. There is no guarding.  Musculoskeletal: Normal range of motion.       Left knee: She exhibits erythema and bony tenderness. She exhibits normal range of motion, no effusion, no deformity, no laceration and normal patellar mobility. Tenderness found. Medial joint line and patellar tendon tenderness noted.       Lumbar back: She exhibits tenderness and spasm.  Left lumbar paraspinal tenderness. No cervical, thoracic, or lumbar step off. No bone tenderness.  Lymphadenopathy:       Head (right side): No submandibular adenopathy present.       Head (left side): No submandibular adenopathy present.    She has no cervical adenopathy.  Neurological: She is alert and oriented to person, place, and time. She has normal strength. No cranial nerve  deficit or sensory deficit. Gait normal.  Pt is ambulatory with minimal problem.  Skin: Skin is warm and dry.  Psychiatric: She has a normal mood and affect. Her speech is normal.  Nursing note and vitals reviewed.   ED Course  Procedures (including critical care time) Labs Review Labs Reviewed - No data to display  Imaging Review No results found. I have personally reviewed and evaluated these images and lab results as part of my medical  decision-making.   EKG Interpretation None      MDM  Xray of the left knee favors djd of the knee, with medial compartment narrowing, but no fx or dislocation. No gross neuro deficit involving the back or lower extremities. Pt ambulatory at d/c and request to go to her floor for work duty. Rx for mobic and norco given to the patient. Pt to see Dr Aline Brochure for additional evaluation and management if not improving.   Final diagnoses:  None    **I have reviewed nursing notes, vital signs, and all appropriate lab and imaging results for this patient.Lily Kocher, PA-C 07/09/15 1004  Elnora Morrison, MD 07/10/15 [id number]  Elnora Morrison, MD 07/10/15 [phone]

## 2015-07-09 NOTE — ED Notes (Signed)
Patient is resting comfortably. 

## 2015-07-09 NOTE — Discharge Instructions (Signed)
Your x-ray is negative for fracture or dislocation. Please use the knee immobilizer over the next 7-10 days. Please see the orthopedic specialist if not improving. Your examination favors a lumbar strain. Please use meloxicam daily with food. The use Tylenol in between the doses if needed for mild pain. Use Norco for more severe pain. This medication may cause drowsiness, please use with caution. Contusion A contusion is a deep bruise. Contusions happen when an injury causes bleeding under the skin. Symptoms of bruising include pain, swelling, and discolored skin. The skin may turn blue, purple, or yellow. HOME CARE   Rest the injured area.  If told, put ice on the injured area.  Put ice in a plastic bag.  Place a towel between your skin and the bag.  Leave the ice on for 20 minutes, 2-3 times per day.  If told, put light pressure (compression) on the injured area using an elastic bandage. Make sure the bandage is not too tight. Remove it and put it back on as told by your doctor.  If possible, raise (elevate) the injured area above the level of your heart while you are sitting or lying down.  Take over-the-counter and prescription medicines only as told by your doctor. GET HELP IF:  Your symptoms do not get better after several days of treatment.  Your symptoms get worse.  You have trouble moving the injured area. GET HELP RIGHT AWAY IF:   You have very bad pain.  You have a loss of feeling (numbness) in a hand or foot.  Your hand or foot turns pale or cold.   This information is not intended to replace advice given to you by your health care provider. Make sure you discuss any questions you have with your health care provider.   Document Released: 12/03/2007 Document Revised: 03/07/2015 Document Reviewed: 11/01/2014 Elsevier Interactive Patient Education 2016 [place].  Muscle Strain A muscle strain (pulled muscle) happens when a muscle is stretched beyond normal  length. It happens when a sudden, violent force stretches your muscle too far. Usually, a few of the fibers in your muscle are torn. Muscle strain is common in athletes. Recovery usually takes 1-2 weeks. Complete healing takes 5-6 weeks.  HOME CARE   Follow the PRICE method of treatment to help your injury get better. Do this the first 2-3 days after the injury:  Protect. Protect the muscle to keep it from getting injured again.  Rest. Limit your activity and rest the injured body part.  Ice. Put ice in a plastic bag. Place a towel between your skin and the bag. Then, apply the ice and leave it on from 15-20 minutes each hour. After the third day, switch to moist heat packs.  Compression. Use a splint or elastic bandage on the injured area for comfort. Do not put it on too tightly.  Elevate. Keep the injured body part above the level of your heart.  Only take medicine as told by your doctor.  Warm up before doing exercise to prevent future muscle strains. GET HELP IF:   You have more pain or puffiness (swelling) in the injured area.  You feel numbness, tingling, or notice a loss of strength in the injured area. MAKE SURE YOU:   Understand these instructions.  Will watch your condition.  Will get help right away if you are not doing well or get worse.   This information is not intended to replace advice given to you by your health care provider.  Make sure you discuss any questions you have with your health care provider.   Document Released: 03/25/2008 Document Revised: 04/06/2013 Document Reviewed: 01/13/2013 Elsevier Interactive Patient Education [employer].

## 2015-07-09 NOTE — ED Notes (Signed)
Pt state she fell in the parking lot coming in to work. Complain of pain in left knee and back

## 2015-08-17 ENCOUNTER — Encounter: Payer: Self-pay | Admitting: Family Medicine

## 2015-08-17 ENCOUNTER — Ambulatory Visit (INDEPENDENT_AMBULATORY_CARE_PROVIDER_SITE_OTHER): Payer: BLUE CROSS/BLUE SHIELD | Admitting: Family Medicine

## 2015-08-17 VITALS — BP 132/78 | HR 70 | Temp 98.1°F | Resp 14 | Ht 65.0 in | Wt 285.0 lb

## 2015-08-17 DIAGNOSIS — R7302 Impaired glucose tolerance (oral): Secondary | ICD-10-CM

## 2015-08-17 DIAGNOSIS — I10 Essential (primary) hypertension: Secondary | ICD-10-CM

## 2015-08-17 DIAGNOSIS — N83201 Unspecified ovarian cyst, right side: Secondary | ICD-10-CM

## 2015-08-17 DIAGNOSIS — N83202 Unspecified ovarian cyst, left side: Secondary | ICD-10-CM

## 2015-08-17 DIAGNOSIS — E669 Obesity, unspecified: Secondary | ICD-10-CM

## 2015-08-17 DIAGNOSIS — D509 Iron deficiency anemia, unspecified: Secondary | ICD-10-CM

## 2015-08-17 MED ORDER — HYDROCHLOROTHIAZIDE 25 MG PO TABS: ORAL_TABLET | ORAL | Status: DC

## 2015-08-17 MED ORDER — NALTREXONE-BUPROPION HCL ER 8-90 MG PO TB12: 2.0000 | ORAL_TABLET | Freq: Two times a day (BID) | ORAL | Status: DC

## 2015-08-17 MED ORDER — VITAMIN D 1000 UNITS PO TABS: 1000.0000 [IU] | ORAL_TABLET | Freq: Every day | ORAL | Status: DC

## 2015-08-17 MED ORDER — CLINDAMYCIN PHOS-BENZOYL PEROX 1-5 % EX GEL: Freq: Two times a day (BID) | CUTANEOUS | Status: DC

## 2015-08-17 MED ORDER — FERROUS SULFATE 325 (65 FE) MG PO TBEC: 325.0000 mg | DELAYED_RELEASE_TABLET | Freq: Two times a day (BID) | ORAL | Status: DC

## 2015-08-17 NOTE — Progress Notes (Signed)
Patient ID: Elizabeth Harper, female   DOB: [birth year], 45 y.o.   MRN: [id number]   Subjective:    Patient ID: Elizabeth Harper, female    DOB: 01/13/1971, 45 y.o.   MRN: [id number]  Patient presents for 6 month F/U  patient here for follow-up. Since her last visit she had ultrasound done to follow-up her fibroid uterus she's not had any abnormal bleeding. She doesn't history of cyst on recent however they were unable to visualize the left side and she is concerned about this. In general her uterus size has become smaller since her ablation.  Obesity she has not made any significant change with her overall weight. She is trying to be more active and change her diet. She recently signed up for cross it. She would like to try a different weight loss medication she has tried phentermine in the past but did not have significant results to stay with her.  She's also concerned about her acne. She seen dermatology in the past he recommended doxycycline that she felt like she was taking it for too long. She left to try something topical  Review Of Systems:  GEN- denies fatigue, fever, weight loss,weakness, recent illness HEENT- denies eye drainage, change in vision, nasal discharge, CVS- denies chest pain, palpitations RESP- denies SOB, cough, wheeze ABD- denies N/V, change in stools, abd pain GU- denies dysuria, hematuria, dribbling, incontinence MSK- denies joint pain, muscle aches, injury Neuro- denies headache, dizziness, syncope, seizure activity       Objective:    BP 132/78 mmHg  Pulse 70  Temp(Src) 98.1 F (36.7 C) (Oral)  Resp 14  Ht 5\' 5"  (1.651 m)  Wt 285 lb (129.275 kg)  BMI 47.43 kg/m2  LMP 07/29/2015 (Approximate) GEN- NAD, alert and oriented x3 HEENT- PERRL, EOMI, non injected sclera, pink conjunctiva, MMM, oropharynx clear Neck- Supple, no thyromegaly CVS- RRR, no murmur RESP-CTAB ABD-NABS,soft,NT,ND EXT- No edema Pulses- Radial, DP- 2+        Assessment & Plan:       Problem List Items Addressed This Visit    Obesity    She would benefit from significant weight loss, with her joint pain, HTN, Glucose interolance elevated cholesterol Trial of Contrave - discussed SE of meds Increase activity       Relevant Medications   Naltrexone-Bupropion HCl ER (CONTRAVE) 8-90 MG TB12   Glucose intolerance (impaired glucose tolerance)    A1C at goal, continue to work on healthy eating       Essential hypertension, benign - Primary    Well controlled      Relevant Medications   hydrochlorothiazide (HYDRODIURIL) 25 MG tablet   Anemia    Recommend iron tabs BID, to help her chronic anemia Also vitamin d 1000IU daily      Relevant Medications   ferrous sulfate 325 (65 FE) MG EC tablet    Other Visit Diagnoses    Cysts of both ovaries        history of bilat cyst and fibroids which she had ablation from, unable to visualize left side, will send for repeat US to have this evaluated    Relevant Orders    US Pelvis Limited       Note: This dictation was prepared with Dragon dictation along with smaller phrase technology. Any transcriptional errors that result from this process are unintentional.

## 2015-08-17 NOTE — Patient Instructions (Addendum)
[place] to be done  Take iron tablets twice a day  Take vitamin D once a day  Use cream twice a day  F/U 3 months for weight

## 2015-08-19 ENCOUNTER — Encounter: Payer: Self-pay | Admitting: Family Medicine

## 2015-08-19 NOTE — Assessment & Plan Note (Signed)
A1C at goal, continue to work on healthy eating

## 2015-08-19 NOTE — Assessment & Plan Note (Signed)
Well controlled 

## 2015-08-19 NOTE — Assessment & Plan Note (Signed)
Recommend iron tabs BID, to help her chronic anemia Also vitamin d 1000IU daily

## 2015-08-19 NOTE — Assessment & Plan Note (Signed)
She would benefit from significant weight loss, with her joint pain, HTN, Glucose interolance elevated cholesterol Trial of Contrave - discussed SE of meds Increase activity

## 2015-08-20 ENCOUNTER — Other Ambulatory Visit: Payer: Self-pay | Admitting: Family Medicine

## 2015-08-20 DIAGNOSIS — N83202 Unspecified ovarian cyst, left side: Principal | ICD-10-CM

## 2015-08-20 DIAGNOSIS — N83201 Unspecified ovarian cyst, right side: Secondary | ICD-10-CM

## 2015-08-23 ENCOUNTER — Ambulatory Visit (HOSPITAL_COMMUNITY): Payer: Self-pay

## 2015-08-23 ENCOUNTER — Ambulatory Visit (HOSPITAL_COMMUNITY): Admission: RE | Admit: 2015-08-23 | Payer: Self-pay | Source: Ambulatory Visit

## 2015-08-30 ENCOUNTER — Ambulatory Visit (HOSPITAL_COMMUNITY): Admission: RE | Admit: 2015-08-30 | Payer: Self-pay | Source: Ambulatory Visit

## 2015-10-18 ENCOUNTER — Telehealth: Payer: Self-pay | Admitting: *Deleted

## 2015-10-18 NOTE — Telephone Encounter (Signed)
Received request from pharmacy for PA on Contrave.   BMI: 47.5.  PA submitted.   Dx: E66.9- obesity.

## 2015-10-22 NOTE — Telephone Encounter (Signed)
Call placed to patient. LMTRC.  

## 2015-10-22 NOTE — Telephone Encounter (Signed)
Received PA determination.   PA denied.   Patient must try and fail (2) formulary alternatives. Alternatives include: Belviq Qsymia  MD to be made aware.

## 2015-10-22 NOTE — Telephone Encounter (Signed)
Call pt she will have to try Belviq 10mg  BID, insurance will not cover Contrave

## 2015-10-23 MED ORDER — LORCASERIN HCL 10 MG PO TABS: 10.0000 mg | ORAL_TABLET | Freq: Two times a day (BID) | ORAL | Status: DC

## 2015-10-23 NOTE — Telephone Encounter (Signed)
Call placed to patient and patient made aware.   States that she will try Belviq.

## 2015-10-26 ENCOUNTER — Ambulatory Visit (INDEPENDENT_AMBULATORY_CARE_PROVIDER_SITE_OTHER): Payer: BLUE CROSS/BLUE SHIELD | Admitting: Family Medicine

## 2015-10-26 ENCOUNTER — Encounter: Payer: Self-pay | Admitting: Family Medicine

## 2015-10-26 VITALS — BP 124/68 | HR 82 | Temp 98.5°F | Resp 14 | Ht 65.0 in | Wt 284.0 lb

## 2015-10-26 DIAGNOSIS — R809 Proteinuria, unspecified: Secondary | ICD-10-CM

## 2015-10-26 DIAGNOSIS — N760 Acute vaginitis: Secondary | ICD-10-CM | POA: Diagnosis not present

## 2015-10-26 LAB — URINALYSIS, ROUTINE W REFLEX MICROSCOPIC
Bilirubin Urine: NEGATIVE
Glucose, UA: NEGATIVE
Hgb urine dipstick: NEGATIVE
Ketones, ur: NEGATIVE
Leukocytes, UA: NEGATIVE
Nitrite: NEGATIVE
Specific Gravity, Urine: 1.025 (ref 1.001–1.035)
pH: 5.5 (ref 5.0–8.0)

## 2015-10-26 LAB — URINALYSIS, MICROSCOPIC ONLY
Casts: NONE SEEN [LPF]
Crystals: NONE SEEN [HPF]
Yeast: NONE SEEN [HPF]

## 2015-10-26 MED ORDER — FLUCONAZOLE 150 MG PO TABS: 150.0000 mg | ORAL_TABLET | Freq: Once | ORAL | Status: DC

## 2015-10-26 NOTE — Patient Instructions (Signed)
Take diflucan as prescribed  Change back the soap  F/U as previous

## 2015-10-26 NOTE — Progress Notes (Signed)
Patient ID: Elizabeth Harper, female   DOB: [birth year], 45 y.o.   MRN: [id number]    Subjective:    Patient ID: Elizabeth Harper, female    DOB: 1971/01/25, 45 y.o.   MRN: [id number]  Patient presents for Dysuria Is here with vaginal itching for the past day. She states that she switch her soap she's not had any significant discharge. One episode she had some mild dysuria that is now cleared up she's not had any urinary frequency. She has not taken anything over-the-counter.    Review Of Systems:  GEN- denies fatigue, fever, weight loss,weakness, recent illness HEENT- denies eye drainage, change in vision, nasal discharge, CVS- denies chest pain, palpitations RESP- denies SOB, cough, wheeze ABD- denies N/V, change in stools, abd pain GU- denies dysuria, hematuria, dribbling, incontinence MSK- denies joint pain, muscle aches, injury Neuro- denies headache, dizziness, syncope, seizure activity       Objective:    BP 124/68 mmHg  Pulse 82  Temp(Src) 98.5 F (36.9 C) (Oral)  Resp 14  Ht 5\' 5"  (1.651 m)  Wt 284 lb (128.822 kg)  BMI 47.26 kg/m2  LMP 10/12/2015 (Approximate) GEN- NAD, alert and oriented x3 Declines GU exam-         Assessment & Plan:      Problem List Items Addressed This Visit    None    Visit Diagnoses    Vaginitis    -  Primary    Likely from use of new soap, now irritated, given diflucan, she has had Yeast infections as well, change to previous non scented soap    Relevant Orders    Urinalysis, Routine w reflex microscopic (not at [facility]) (Completed)    Urine culture    Protein in urine        Protein noted, increase fluids, check urine micro. Doubt UTI related     Relevant Orders    Microalbumin / creatinine urine ratio       Note: This dictation was prepared with Dragon dictation along with smaller phrase technology. Any transcriptional errors that result from this process are unintentional.

## 2015-10-27 LAB — MICROALBUMIN / CREATININE URINE RATIO
Creatinine, Urine: 257 mg/dL (ref 20–320)
Microalb Creat Ratio: 18 mcg/mg creat (ref <30)
Microalb, Ur: 4.5 mg/dL

## 2015-10-28 LAB — URINE CULTURE: Colony Count: 70000

## 2015-10-31 ENCOUNTER — Ambulatory Visit (INDEPENDENT_AMBULATORY_CARE_PROVIDER_SITE_OTHER): Payer: BLUE CROSS/BLUE SHIELD | Admitting: Internal Medicine

## 2015-10-31 DIAGNOSIS — Z9189 Other specified personal risk factors, not elsewhere classified: Secondary | ICD-10-CM | POA: Diagnosis not present

## 2015-10-31 DIAGNOSIS — Z23 Encounter for immunization: Secondary | ICD-10-CM

## 2015-10-31 MED ORDER — CIPROFLOXACIN HCL 500 MG PO TABS
500.0000 mg | ORAL_TABLET | Freq: Two times a day (BID) | ORAL | Status: DC
Start: 2015-10-31 — End: 2016-04-18

## 2015-10-31 MED ORDER — ATOVAQUONE-PROGUANIL HCL 250-100 MG PO TABS: 1.0000 | ORAL_TABLET | Freq: Every day | ORAL | Status: DC
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
## 2015-11-16 ENCOUNTER — Ambulatory Visit: Payer: BLUE CROSS/BLUE SHIELD | Admitting: Family Medicine

## 2015-11-21 ENCOUNTER — Encounter: Payer: Self-pay | Admitting: Family Medicine

## 2016-01-29 ENCOUNTER — Telehealth: Payer: Self-pay | Admitting: *Deleted

## 2016-01-29 MED ORDER — FLUCONAZOLE 150 MG PO TABS: 150.0000 mg | ORAL_TABLET | Freq: Once | ORAL | 1 refills | Status: AC

## 2016-01-29 NOTE — Telephone Encounter (Signed)
Received call from patient.   Reports increase in vaginal itching and t hick white discharge.   Requested order for Diflucan.   Prescription sent to pharmacy. Advised that if S/Sx do not resolve after dosage, OV will be required.

## 2016-02-14 ENCOUNTER — Emergency Department (HOSPITAL_COMMUNITY): Payer: PRIVATE HEALTH INSURANCE

## 2016-02-14 ENCOUNTER — Encounter (HOSPITAL_COMMUNITY): Payer: Self-pay | Admitting: Emergency Medicine

## 2016-02-14 ENCOUNTER — Emergency Department (HOSPITAL_COMMUNITY)
Admission: EM | Admit: 2016-02-14 | Discharge: 2016-02-14 | Disposition: A | Discharge status: Home / Self Care | Payer: PRIVATE HEALTH INSURANCE | Attending: Emergency Medicine | Admitting: Emergency Medicine

## 2016-02-14 DIAGNOSIS — Y929 Unspecified place or not applicable: Secondary | ICD-10-CM | POA: Insufficient documentation

## 2016-02-14 DIAGNOSIS — Z79899 Other long term (current) drug therapy: Secondary | ICD-10-CM | POA: Insufficient documentation

## 2016-02-14 DIAGNOSIS — Y939 Activity, unspecified: Secondary | ICD-10-CM | POA: Diagnosis not present

## 2016-02-14 DIAGNOSIS — W19XXXA Unspecified fall, initial encounter: Secondary | ICD-10-CM

## 2016-02-14 DIAGNOSIS — M25561 Pain in right knee: Secondary | ICD-10-CM | POA: Diagnosis not present

## 2016-02-14 DIAGNOSIS — I10 Essential (primary) hypertension: Secondary | ICD-10-CM | POA: Diagnosis not present

## 2016-02-14 DIAGNOSIS — W010XXA Fall on same level from slipping, tripping and stumbling without subsequent striking against object, initial encounter: Secondary | ICD-10-CM | POA: Insufficient documentation

## 2016-02-14 DIAGNOSIS — Y999 Unspecified external cause status: Secondary | ICD-10-CM | POA: Insufficient documentation

## 2016-02-14 MED ORDER — NAPROXEN 250 MG PO TABS: 250.0000 mg | ORAL_TABLET | Freq: Two times a day (BID) | ORAL | 0 refills | Status: DC | PRN

## 2016-02-14 NOTE — Discharge Instructions (Signed)
Take the prescription as directed. May also take over the counter tylenol, as directed on packaging, as needed for discomfort. Apply moist heat or ice to the area(s) of discomfort, for 15 minutes at a time, several times per day for the next few days.  Do not fall asleep on a heating or ice pack.  Wear the ACE wrap and use the crutches until you are seen in follow up. Call the Orthopedic doctor today to schedule a follow up appointment in the next 4 days.  Return to the Emergency Department immediately if worsening.
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
## 2016-02-14 NOTE — ED Notes (Signed)
Ace wrap applied to right knee per Dr. Elise Benne' orders.

## 2016-02-14 NOTE — ED Triage Notes (Signed)
Pt was at work today and slipped and fell on water and right knee folded under her.  Pt having right knee pain at this time.  Denies head injury or LOC.

## 2016-04-18 ENCOUNTER — Telehealth: Payer: Self-pay | Admitting: *Deleted

## 2016-04-18 ENCOUNTER — Other Ambulatory Visit: Payer: Self-pay | Admitting: *Deleted

## 2016-04-18 MED ORDER — CIPROFLOXACIN HCL 500 MG PO TABS: 500.0000 mg | ORAL_TABLET | Freq: Two times a day (BID) | ORAL | 0 refills | Status: DC

## 2016-04-18 MED ORDER — FLUCONAZOLE 150 MG PO TABS: 150.0000 mg | ORAL_TABLET | Freq: Once | ORAL | 0 refills | Status: AC

## 2016-04-18 NOTE — Telephone Encounter (Signed)
Prescription sent to pharmacy.   Call placed to patient and patient made aware per VM. Recommended that if symptoms worsen or persist >1 week to contact office for visit.

## 2016-04-18 NOTE — Telephone Encounter (Signed)
Received call from patient.   States that she has urinary frequency, urgency and lower back pain.   States that she is working all weekend and cannot come in for appointment.   MD please advise.

## 2016-04-18 NOTE — Telephone Encounter (Signed)
Okay to send in Cipro 500mg  BID for 3 days, increase fluids

## 2016-04-23 ENCOUNTER — Encounter (HOSPITAL_COMMUNITY): Payer: Self-pay | Admitting: Emergency Medicine

## 2016-04-23 ENCOUNTER — Emergency Department (HOSPITAL_COMMUNITY): Payer: No Typology Code available for payment source

## 2016-04-23 ENCOUNTER — Emergency Department (HOSPITAL_COMMUNITY)
Admission: EM | Admit: 2016-04-23 | Discharge: 2016-04-23 | Disposition: A | Discharge status: Home / Self Care | Payer: No Typology Code available for payment source | Attending: Emergency Medicine | Admitting: Emergency Medicine

## 2016-04-23 DIAGNOSIS — Z79899 Other long term (current) drug therapy: Secondary | ICD-10-CM | POA: Insufficient documentation

## 2016-04-23 DIAGNOSIS — Y9389 Activity, other specified: Secondary | ICD-10-CM | POA: Diagnosis not present

## 2016-04-23 DIAGNOSIS — Y999 Unspecified external cause status: Secondary | ICD-10-CM | POA: Insufficient documentation

## 2016-04-23 DIAGNOSIS — I10 Essential (primary) hypertension: Secondary | ICD-10-CM | POA: Diagnosis not present

## 2016-04-23 DIAGNOSIS — Y92411 Interstate highway as the place of occurrence of the external cause: Secondary | ICD-10-CM | POA: Diagnosis not present

## 2016-04-23 DIAGNOSIS — S20219A Contusion of unspecified front wall of thorax, initial encounter: Secondary | ICD-10-CM | POA: Insufficient documentation

## 2016-04-23 DIAGNOSIS — S161XXA Strain of muscle, fascia and tendon at neck level, initial encounter: Secondary | ICD-10-CM

## 2016-04-23 DIAGNOSIS — Z792 Long term (current) use of antibiotics: Secondary | ICD-10-CM | POA: Insufficient documentation

## 2016-04-23 DIAGNOSIS — S299XXA Unspecified injury of thorax, initial encounter: Secondary | ICD-10-CM | POA: Diagnosis present

## 2016-04-23 DIAGNOSIS — S20211A Contusion of right front wall of thorax, initial encounter: Secondary | ICD-10-CM

## 2016-04-23 MED ORDER — HYDROCODONE-ACETAMINOPHEN 5-325 MG PO TABS: 2.0000 | ORAL_TABLET | ORAL | 0 refills | Status: DC | PRN

## 2016-04-23 MED ORDER — METHOCARBAMOL 500 MG PO TABS: 500.0000 mg | ORAL_TABLET | Freq: Four times a day (QID) | ORAL | 0 refills | Status: DC

## 2016-04-23 MED ORDER — HYDROCODONE-ACETAMINOPHEN 5-325 MG PO TABS
2.0000 | ORAL_TABLET | Freq: Once | ORAL | Status: AC
  Administered 2016-04-23: 2 via ORAL
  Filled 2016-04-23: qty 2
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
## 2016-04-23 NOTE — ED Notes (Signed)
C- collar applied at triage . 

## 2016-04-23 NOTE — ED Triage Notes (Signed)
c-collar applied in triage c/o neck pain
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
## 2016-05-08 ENCOUNTER — Ambulatory Visit (INDEPENDENT_AMBULATORY_CARE_PROVIDER_SITE_OTHER): Payer: Self-pay | Admitting: Physician Assistant

## 2016-05-08 ENCOUNTER — Encounter: Payer: Self-pay | Admitting: Physician Assistant

## 2016-05-08 VITALS — BP 124/68 | HR 85 | Temp 98.1°F | Resp 16 | Ht 65.0 in | Wt 275.0 lb

## 2016-05-08 DIAGNOSIS — M542 Cervicalgia: Secondary | ICD-10-CM
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
## 2016-06-06 ENCOUNTER — Ambulatory Visit (INDEPENDENT_AMBULATORY_CARE_PROVIDER_SITE_OTHER): Payer: Self-pay | Admitting: Family Medicine

## 2016-06-06 ENCOUNTER — Encounter: Payer: Self-pay | Admitting: Family Medicine

## 2016-06-06 VITALS — BP 138/62 | HR 88 | Temp 98.7°F | Resp 14 | Ht 65.0 in | Wt 277.0 lb

## 2016-06-06 DIAGNOSIS — Z113 Encounter for screening for infections with a predominantly sexual mode of transmission: Secondary | ICD-10-CM

## 2016-06-06 DIAGNOSIS — L700 Acne vulgaris: Secondary | ICD-10-CM

## 2016-06-06 DIAGNOSIS — S161XXD Strain of muscle, fascia and tendon at neck level, subsequent encounter: Secondary | ICD-10-CM

## 2016-06-06 DIAGNOSIS — N760 Acute vaginitis: Secondary | ICD-10-CM

## 2016-06-06 DIAGNOSIS — R300 Dysuria: Secondary | ICD-10-CM

## 2016-06-06 LAB — URINALYSIS, ROUTINE W REFLEX MICROSCOPIC
Bilirubin Urine: NEGATIVE
Glucose, UA: NEGATIVE
Hgb urine dipstick: NEGATIVE
Ketones, ur: NEGATIVE
Leukocytes, UA: NEGATIVE
Nitrite: NEGATIVE
Protein, ur: NEGATIVE
Specific Gravity, Urine: 1.02 (ref 1.001–1.035)
pH: 6 (ref 5.0–8.0)

## 2016-06-06 LAB — WET PREP FOR TRICH, YEAST, CLUE
Clue Cells Wet Prep HPF POC: NONE SEEN
Trich, Wet Prep: NONE SEEN
Yeast Wet Prep HPF POC: NONE SEEN

## 2016-06-06 MED ORDER — DOXYCYCLINE HYCLATE 50 MG PO CAPS: 50.0000 mg | ORAL_CAPSULE | Freq: Every day | ORAL | 6 refills | Status: DC

## 2016-06-06 MED ORDER — FLUCONAZOLE 150 MG PO TABS: 150.0000 mg | ORAL_TABLET | Freq: Every day | ORAL | 1 refills | Status: DC
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
## 2016-06-06 NOTE — Patient Instructions (Signed)
Use vagisil cream Take doxycycline as prescribed We will call with other labs F/U 6 months

## 2016-06-07 LAB — HIV ANTIBODY (ROUTINE TESTING W REFLEX): HIV 1&2 Ab, 4th Generation: NONREACTIVE

## 2016-06-07 LAB — RPR

## 2016-06-07 LAB — HSV(HERPES SIMPLEX VRS) I + II AB-IGG

## 2016-06-08 LAB — URINE CULTURE

## 2016-06-08 LAB — GC/CHLAMYDIA PROBE AMP
CT Probe RNA: NOT DETECTED
GC Probe RNA: NOT DETECTED

## 2016-06-11 ENCOUNTER — Other Ambulatory Visit: Payer: Self-pay | Admitting: *Deleted

## 2016-06-11 ENCOUNTER — Telehealth: Payer: Self-pay | Admitting: *Deleted

## 2016-06-11 DIAGNOSIS — Z113 Encounter for screening for infections with a predominantly sexual mode of transmission: Secondary | ICD-10-CM

## 2016-06-11 NOTE — Telephone Encounter (Signed)
Received call from patient.   Reports that she has concerns with results of STI check.   States that she would like HSV panel re-drawn to make sure that it is not a false positive.   Advised to return to lab or clinic to have blood drawn.   Future orders placed.

## 2016-06-12 LAB — HSV TYPE I/II IGG, IGMW/ REFLEX
HSV 1 Glycoprotein G Ab, IgG: 8.96 Index — ABNORMAL HIGH (ref <0.90)
HSV 1 IgM Screen: NEGATIVE
HSV 2 Glycoprotein G Ab, IgG: <0.9 Index (ref <0.90)
HSV 2 IgM Screen: NEGATIVE

## 2016-07-24 ENCOUNTER — Other Ambulatory Visit: Payer: Self-pay | Admitting: Family Medicine

## 2016-09-25 ENCOUNTER — Other Ambulatory Visit: Payer: Self-pay | Admitting: Family Medicine

## 2016-09-25 DIAGNOSIS — Z1231 Encounter for screening mammogram for malignant neoplasm of breast: Secondary | ICD-10-CM

## 2016-10-01 ENCOUNTER — Ambulatory Visit (HOSPITAL_COMMUNITY)
Admission: RE | Admit: 2016-10-01 | Discharge: 2016-10-01 | Disposition: A | Discharge status: Home / Self Care | Payer: BLUE CROSS/BLUE SHIELD | Source: Ambulatory Visit | Attending: Family Medicine | Admitting: Family Medicine

## 2016-10-01 ENCOUNTER — Ambulatory Visit (HOSPITAL_COMMUNITY): Payer: Self-pay

## 2016-10-01 DIAGNOSIS — Z1231 Encounter for screening mammogram for malignant neoplasm of breast: Secondary | ICD-10-CM | POA: Insufficient documentation

## 2016-10-27 ENCOUNTER — Encounter: Payer: Self-pay | Admitting: Family Medicine

## 2016-12-08 ENCOUNTER — Encounter: Payer: Self-pay | Admitting: Family Medicine

## 2017-01-23 ENCOUNTER — Other Ambulatory Visit: Payer: Self-pay | Admitting: Family Medicine

## 2017-03-09 ENCOUNTER — Encounter: Payer: Self-pay | Admitting: Family Medicine

## 2017-04-06 ENCOUNTER — Encounter: Payer: Self-pay | Admitting: Family Medicine

## 2017-05-04 ENCOUNTER — Encounter: Payer: Self-pay | Admitting: Family Medicine

## 2017-07-11 ENCOUNTER — Other Ambulatory Visit: Payer: Self-pay | Admitting: Family Medicine

## 2017-12-22 ENCOUNTER — Encounter: Payer: Self-pay | Admitting: Family Medicine

## 2018-06-08 ENCOUNTER — Emergency Department (HOSPITAL_COMMUNITY)
Admission: EM | Admit: 2018-06-08 | Discharge: 2018-06-08 | Disposition: A | Discharge status: Home / Self Care | Payer: PRIVATE HEALTH INSURANCE | Attending: Emergency Medicine | Admitting: Emergency Medicine

## 2018-06-08 ENCOUNTER — Encounter (HOSPITAL_COMMUNITY): Payer: Self-pay | Admitting: *Deleted

## 2018-06-08 ENCOUNTER — Emergency Department (HOSPITAL_COMMUNITY): Payer: PRIVATE HEALTH INSURANCE

## 2018-06-08 ENCOUNTER — Other Ambulatory Visit: Payer: Self-pay

## 2018-06-08 DIAGNOSIS — W231XXA Caught, crushed, jammed, or pinched between stationary objects, initial encounter: Secondary | ICD-10-CM | POA: Diagnosis not present

## 2018-06-08 DIAGNOSIS — Y9389 Activity, other specified: Secondary | ICD-10-CM | POA: Diagnosis not present

## 2018-06-08 DIAGNOSIS — Z79899 Other long term (current) drug therapy: Secondary | ICD-10-CM | POA: Insufficient documentation

## 2018-06-08 DIAGNOSIS — Y929 Unspecified place or not applicable: Secondary | ICD-10-CM | POA: Insufficient documentation

## 2018-06-08 DIAGNOSIS — I10 Essential (primary) hypertension: Secondary | ICD-10-CM | POA: Diagnosis not present

## 2018-06-08 DIAGNOSIS — S62524A Nondisplaced fracture of distal phalanx of right thumb, initial encounter for closed fracture: Secondary | ICD-10-CM | POA: Diagnosis not present

## 2018-06-08 DIAGNOSIS — L03011 Cellulitis of right finger: Secondary | ICD-10-CM

## 2018-06-08 DIAGNOSIS — L03113 Cellulitis of right upper limb: Secondary | ICD-10-CM | POA: Insufficient documentation

## 2018-06-08 DIAGNOSIS — S6991XA Unspecified injury of right wrist, hand and finger(s), initial encounter: Secondary | ICD-10-CM | POA: Diagnosis present

## 2018-06-08 DIAGNOSIS — Y999 Unspecified external cause status: Secondary | ICD-10-CM | POA: Insufficient documentation

## 2018-06-08 DIAGNOSIS — Z760 Encounter for issue of repeat prescription: Secondary | ICD-10-CM

## 2018-06-08 MED ORDER — OXYCODONE-ACETAMINOPHEN 5-325 MG PO TABS
1.0000 | ORAL_TABLET | Freq: Once | ORAL | Status: AC
  Administered 2018-06-08: 1 via ORAL
  Filled 2018-06-08: qty 1

## 2018-06-08 MED ORDER — HYDROCHLOROTHIAZIDE 25 MG PO TABS: 25.0000 mg | ORAL_TABLET | Freq: Every day | ORAL | 0 refills | Status: DC

## 2018-06-08 MED ORDER — SULFAMETHOXAZOLE-TRIMETHOPRIM 800-160 MG PO TABS: 1.0000 | ORAL_TABLET | Freq: Two times a day (BID) | ORAL | 0 refills | Status: AC

## 2018-06-08 MED ORDER — HYDROCODONE-ACETAMINOPHEN 5-325 MG PO TABS: 1.0000 | ORAL_TABLET | ORAL | 0 refills | Status: DC | PRN

## 2018-06-08 MED ORDER — SULFAMETHOXAZOLE-TRIMETHOPRIM 800-160 MG PO TABS
1.0000 | ORAL_TABLET | Freq: Once | ORAL | Status: AC
  Administered 2018-06-08: 1 via ORAL
  Filled 2018-06-08: qty 1
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
## 2018-06-08 NOTE — ED Triage Notes (Signed)
Pt states that she caught her right thumb against the computer Sunday, c/o pain amd swelling noted to right thumb

## 2018-06-08 NOTE — Discharge Instructions (Addendum)
Soak your thumb in warm epsom salt water twice daily as discussed for 10 minutes, gentle massage to keep this cleaned out can also be helpful.  Take the entire course of the antibiotics prescribed.  You will need follow up care with orthopedics to ensure this fracture is healing properly.  Cal for an office follow up this week.

## 2018-11-26 ENCOUNTER — Ambulatory Visit (INDEPENDENT_AMBULATORY_CARE_PROVIDER_SITE_OTHER): Payer: No Typology Code available for payment source | Admitting: Family Medicine

## 2018-11-26 ENCOUNTER — Other Ambulatory Visit: Payer: Self-pay

## 2018-11-26 VITALS — BP 120/70 | HR 85 | Temp 98.5°F | Resp 18 | Ht 66.0 in | Wt 286.6 lb

## 2018-11-26 DIAGNOSIS — I10 Essential (primary) hypertension: Secondary | ICD-10-CM | POA: Diagnosis not present

## 2018-11-26 DIAGNOSIS — R600 Localized edema: Secondary | ICD-10-CM

## 2018-11-26 DIAGNOSIS — D508 Other iron deficiency anemias: Secondary | ICD-10-CM

## 2018-11-26 DIAGNOSIS — R7302 Impaired glucose tolerance (oral): Secondary | ICD-10-CM | POA: Diagnosis not present

## 2018-11-26 DIAGNOSIS — E559 Vitamin D deficiency, unspecified: Secondary | ICD-10-CM

## 2018-11-26 MED ORDER — HYDROCHLOROTHIAZIDE 25 MG PO TABS: 25.0000 mg | ORAL_TABLET | Freq: Every day | ORAL | 6 refills | Status: DC

## 2018-11-26 MED ORDER — LIRAGLUTIDE -WEIGHT MANAGEMENT 18 MG/3ML ~~LOC~~ SOPN: 0.6000 mg | PEN_INJECTOR | Freq: Every day | SUBCUTANEOUS | 3 refills | Status: DC

## 2018-11-26 NOTE — Patient Instructions (Signed)
Start saxenda:  Week 1: 0.6mg  injected daily:   Week 2:  1.2 mg injected daily  Week 3:  1.8mg  injected daily  Week 4:  2.4mg  injected daily  Week 5:  3mg  injected daily F/U 2 months

## 2018-11-26 NOTE — Progress Notes (Signed)
   Subjective:    Patient ID: Elizabeth Harper, female    DOB: 1970-08-14, 48 y.o.   MRN: [id number]  Patient presents for Medication Refill (All)  Last visit Dec 2017. Overdue for labs and physical   HTN- has been out of HCTZ for past month or so, last given script for ED and another physician she works with. When she does check BP often elevated off the medication.  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 but does not wear her compression hose   Obesity- has gained significant weight- admits she is eating out, snacking in break room, eating late,  Wants to get on track and work on [employer] for [employer] for exercise  Would like to discuss weight loss medication  She does take iron and vitamin D      Review Of Systems:  GEN- +fatigue denies , fever, weight loss,weakness, recent illness HEENT- denies eye drainage, change in vision, nasal discharge, CVS- denies chest pain, palpitations RESP- denies SOB, cough, wheeze ABD- denies N/V, change in stools, abd pain GU- denies dysuria, hematuria, dribbling, incontinence MSK- denies joint pain, muscle aches, injury Neuro- denies headache, dizziness, syncope, seizure activity       Objective:    BP 120/70   Pulse 85   Temp 98.5 F (36.9 C)   Resp 18   Ht 5\' 6"  (1.676 m)   Wt 286 lb 9.6 oz (130 kg)   SpO2 98%   BMI 46.26 kg/m  GEN- NAD, alert and oriented x3,obese  HEENT- PERRL, EOMI, non injected sclera, pink conjunctiva, MMM, oropharynx clear Neck- Supple, no thyromegaly CVS- RRR, no murmur RESP-CTAB ABD-NABS,soft,NT,ND EXT- trace ankle edema Pulses- Radial, DP- 2+        Assessment & Plan:      Problem List Items Addressed This Visit      Unprioritized   Anemia   Relevant Orders   CBC with Differential/Platelet   Iron, TIBC and Ferritin Panel   Essential hypertension, benign - Primary    Restart HCTZ once a day continue to monitor BP Also helps with the edema Recommend she wear  the compression hose Mostly venous pooling with her obesity and standing on feet Given script for fasting labs      Relevant Medications   hydrochlorothiazide (HYDRODIURIL) 25 MG tablet   Other Relevant Orders   CBC with Differential/Platelet   Comprehensive metabolic panel   Lipid panel   TSH   Glucose intolerance (impaired glucose tolerance)   Relevant Orders   Hemoglobin A1c   Leg edema   Morbid obesity ([place])    Discussed dietary changes to address Start with meal prep, taking her lunch and healthy snack to work Discussed Saxenda which would be a good option for her, she has pre diabetes as well Given handout, will plan to start if labs are sufficient       Relevant Medications   Liraglutide -Weight Management ([place]) 18 MG/3ML SOPN   Vitamin D deficiency   Relevant Orders   Vitamin D, 25-hydroxy      Note: This dictation was prepared with Dragon dictation along with smaller phrase technology. Any transcriptional errors that result from this process are unintentional.

## 2018-11-28 ENCOUNTER — Encounter: Payer: Self-pay | Admitting: Family Medicine

## 2018-11-28 NOTE — Assessment & Plan Note (Signed)
Discussed dietary changes to address Start with meal prep, taking her lunch and healthy snack to work Discussed Saxenda which would be a good option for her, she has pre diabetes as well Given handout, will plan to start if labs are sufficient

## 2018-11-28 NOTE — Assessment & Plan Note (Signed)
Restart HCTZ once a day continue to monitor BP Also helps with the edema Recommend she wear the compression hose Mostly venous pooling with her obesity and standing on feet Given script for fasting labs

## 2018-11-29 ENCOUNTER — Other Ambulatory Visit (HOSPITAL_COMMUNITY)
Admission: RE | Admit: 2018-11-29 | Discharge: 2018-11-29 | Disposition: A | Discharge status: Home / Self Care | Payer: No Typology Code available for payment source | Source: Ambulatory Visit | Attending: Family Medicine | Admitting: Family Medicine

## 2018-11-29 DIAGNOSIS — D508 Other iron deficiency anemias: Secondary | ICD-10-CM | POA: Insufficient documentation

## 2018-11-29 DIAGNOSIS — I10 Essential (primary) hypertension: Secondary | ICD-10-CM | POA: Insufficient documentation

## 2018-11-29 LAB — IRON AND TIBC
Iron: 25 ug/dL — ABNORMAL LOW (ref 28–170)
Saturation Ratios: 6 % — ABNORMAL LOW (ref 10.4–31.8)
TIBC: 449 ug/dL (ref 250–450)
UIBC: 424 ug/dL

## 2018-11-29 LAB — LIPID PANEL
Cholesterol: 167 mg/dL (ref 0–200)
HDL: 43 mg/dL (ref >40)
LDL Cholesterol: 110 mg/dL — ABNORMAL HIGH (ref 0–99)
Total CHOL/HDL Ratio: 3.9 RATIO
Triglycerides: 68 mg/dL (ref <150)
VLDL: 14 mg/dL (ref 0–40)

## 2018-11-29 LAB — COMPREHENSIVE METABOLIC PANEL
ALT: 15 U/L (ref 0–44)
AST: 11 U/L — ABNORMAL LOW (ref 15–41)
Albumin: 3.2 g/dL — ABNORMAL LOW (ref 3.5–5.0)
Alkaline Phosphatase: 92 U/L (ref 38–126)
Anion gap: 13 (ref 5–15)
BUN: 10 mg/dL (ref 6–20)
CO2: 24 mmol/L (ref 22–32)
Calcium: 8.8 mg/dL — ABNORMAL LOW (ref 8.9–10.3)
Chloride: 100 mmol/L (ref 98–111)
Creatinine, Ser: 0.65 mg/dL (ref 0.44–1.00)
GFR calc Af Amer: >60 mL/min (ref >60)
GFR calc non Af Amer: >60 mL/min (ref >60)
Glucose, Bld: 95 mg/dL (ref 70–99)
Potassium: 3.4 mmol/L — ABNORMAL LOW (ref 3.5–5.1)
Sodium: 137 mmol/L (ref 135–145)
Total Bilirubin: 0.3 mg/dL (ref 0.3–1.2)
Total Protein: 7.1 g/dL (ref 6.5–8.1)

## 2018-11-29 LAB — CBC WITH DIFFERENTIAL/PLATELET
Abs Immature Granulocytes: 0.02 10*3/uL (ref 0.00–0.07)
Basophils Absolute: 0 10*3/uL (ref 0.0–0.1)
Basophils Relative: 0 %
Eosinophils Absolute: 0.1 10*3/uL (ref 0.0–0.5)
Eosinophils Relative: 1 %
HCT: 34.7 % — ABNORMAL LOW (ref 36.0–46.0)
Hemoglobin: 10.4 g/dL — ABNORMAL LOW (ref 12.0–15.0)
Immature Granulocytes: 0 %
Lymphocytes Relative: 22 %
Lymphs Abs: 1.7 10*3/uL (ref 0.7–4.0)
MCH: 20.7 pg — ABNORMAL LOW (ref 26.0–34.0)
MCHC: 30 g/dL (ref 30.0–36.0)
MCV: 69.1 fL — ABNORMAL LOW (ref 80.0–100.0)
Monocytes Absolute: 0.3 10*3/uL (ref 0.1–1.0)
Monocytes Relative: 4 %
Neutro Abs: 5.6 10*3/uL (ref 1.7–7.7)
Neutrophils Relative %: 73 %
Platelets: 324 10*3/uL (ref 150–400)
RBC: 5.02 MIL/uL (ref 3.87–5.11)
RDW: 17.2 % — ABNORMAL HIGH (ref 11.5–15.5)
WBC: 7.7 10*3/uL (ref 4.0–10.5)
nRBC: 0 % (ref 0.0–0.2)

## 2018-11-29 LAB — FERRITIN: Ferritin: 8 ng/mL — ABNORMAL LOW (ref 11–307)

## 2018-11-29 LAB — HEMOGLOBIN A1C
Hgb A1c MFr Bld: 5.5 % (ref 4.8–5.6)
Mean Plasma Glucose: 111.15 mg/dL

## 2018-11-29 LAB — TSH: TSH: 1.688 u[IU]/mL (ref 0.350–4.500)

## 2018-11-30 LAB — VITAMIN D 25 HYDROXY (VIT D DEFICIENCY, FRACTURES): Vit D, 25-Hydroxy: 30.2 ng/mL (ref 30.0–100.0)

## 2018-12-01 ENCOUNTER — Other Ambulatory Visit: Payer: Self-pay

## 2018-12-01 ENCOUNTER — Telehealth: Payer: Self-pay | Admitting: Family Medicine

## 2018-12-01 MED ORDER — POTASSIUM CHLORIDE CRYS ER 20 MEQ PO TBCR: 20.0000 meq | EXTENDED_RELEASE_TABLET | Freq: Two times a day (BID) | ORAL | 0 refills | Status: DC

## 2018-12-01 MED ORDER — FERROUS SULFATE 325 (65 FE) MG PO TABS: 325.0000 mg | ORAL_TABLET | Freq: Two times a day (BID) | ORAL | 0 refills | Status: DC

## 2018-12-01 NOTE — Telephone Encounter (Signed)
Pt called and states that she is having issues with getting the [place] and would like to start phentermine until she can get the [place] approved through insurance. Please advise.

## 2018-12-01 NOTE — Telephone Encounter (Signed)
She works for [employer] needs a PA Okay to start phentermine in the mean time

## 2018-12-02 MED ORDER — PHENTERMINE HCL 37.5 MG PO TABS: 37.5000 mg | ORAL_TABLET | Freq: Every day | ORAL | 1 refills | Status: DC

## 2018-12-02 NOTE — Telephone Encounter (Signed)
OK please send to pharm.

## 2018-12-03 ENCOUNTER — Telehealth: Payer: Self-pay

## 2018-12-03 NOTE — Telephone Encounter (Signed)
PA submitted via CMM for Saxenda 18MG /3ML pen-injectors. PA was denied.

## 2018-12-07 NOTE — Telephone Encounter (Signed)
Received PA determination.   PA denied as patient requires previous trial and failure of Contrave.   Contrave is contraindicated in persons on concurrent treatment of HTN.   Appeal letter to be faxed.

## 2018-12-16 ENCOUNTER — Other Ambulatory Visit: Payer: Self-pay | Admitting: *Deleted

## 2018-12-22 NOTE — Telephone Encounter (Signed)
Received fax from [facility]. Reports that they are handling appeal for Saxenda. Requested pertinent medical records in regards to appeal.   Faxed requested documentation.

## 2019-01-21 ENCOUNTER — Other Ambulatory Visit: Payer: Self-pay | Admitting: Family Medicine

## 2019-01-26 NOTE — Telephone Encounter (Signed)
Received appeal determination.   Appeal denied.

## 2019-01-27 ENCOUNTER — Other Ambulatory Visit: Payer: Self-pay

## 2019-01-28 ENCOUNTER — Encounter: Payer: Self-pay | Admitting: Family Medicine

## 2019-01-28 ENCOUNTER — Ambulatory Visit (INDEPENDENT_AMBULATORY_CARE_PROVIDER_SITE_OTHER): Payer: No Typology Code available for payment source | Admitting: Family Medicine

## 2019-01-28 VITALS — BP 124/66 | HR 88 | Temp 98.3°F | Resp 14 | Ht 66.0 in | Wt 278.0 lb

## 2019-01-28 DIAGNOSIS — I10 Essential (primary) hypertension: Secondary | ICD-10-CM | POA: Diagnosis not present

## 2019-01-28 MED ORDER — PHENTERMINE HCL 37.5 MG PO TABS: 37.5000 mg | ORAL_TABLET | Freq: Every day | ORAL | 1 refills | Status: DC

## 2019-01-28 NOTE — Progress Notes (Signed)
   Subjective:    Patient ID: Elizabeth Harper, female    DOB: 27-Aug-1970, 48 y.o.   MRN: [id number]  Patient presents for Follow-up (is fsting)  Patient here to follow-up weight.  She was seen back in May that time she was also restarted on her blood pressure medication hydrochlorothiazide. She has known glucose intolerance A1c resulted at 5.5% lipids were fairly normal LDL was 110 Discuss Saxenda at that time however this was not covered under her insurance plan.  She was therefore started on phentermine.  Weight in May was 287 pounds today 278 pounds She feels okay no concerns today    Had low K on previous labs, due for recheck      Review Of Systems:  GEN- denies fatigue, fever, weight loss,weakness, recent illness HEENT- denies eye drainage, change in vision, nasal discharge, CVS- denies chest pain, palpitations RESP- denies SOB, cough, wheeze ABD- denies N/V, change in stools, abd pain GU- denies dysuria, hematuria, dribbling, incontinence MSK- denies joint pain, muscle aches, injury Neuro- denies headache, dizziness, syncope, seizure activity       Objective:    BP 124/66   Pulse 88   Temp 98.3 F (36.8 C) (Oral)   Resp 14   Ht 5\' 6"  (1.676 m)   Wt 278 lb (126.1 kg)   LMP 01/23/2019 Comment: regular  SpO2 98%   BMI 44.87 kg/m  GEN- NAD, alert and oriented x3 HEENT- PERRL, EOMI, non injected sclera, pink conjunctiva, MMM, oropharynx clear Neck- Supple, no thyromegaly CVS- RRR, no murmur RESP-CTAB ABD-NABS,soft,NT,ND EXT- No edema Pulses- Radial, DP- 2+        Assessment & Plan:      Problem List Items Addressed This Visit      Unprioritized   Essential hypertension, benign - Primary    Well controlled, recheck potassium levels       Relevant Orders   Basic metabolic panel   Morbid obesity ([place])    Continue with phentermine, continue with changes in diet Needs to increase veggies and water F/u 3 months for recheck       Relevant Medications    phentermine (ADIPEX-P) 37.5 MG tablet      Note: This dictation was prepared with Dragon dictation along with smaller phrase technology. Any transcriptional errors that result from this process are unintentional.

## 2019-01-28 NOTE — Assessment & Plan Note (Signed)
Continue with phentermine, continue with changes in diet Needs to increase veggies and water F/u 3 months for recheck

## 2019-01-28 NOTE — Assessment & Plan Note (Signed)
Well controlled, recheck potassium levels

## 2019-01-28 NOTE — Patient Instructions (Signed)
F/U 2 months for Physical with PAP

## 2019-01-29 LAB — BASIC METABOLIC PANEL
BUN: 11 mg/dL (ref 7–25)
CO2: 27 mmol/L (ref 20–32)
Calcium: 9.1 mg/dL (ref 8.6–10.2)
Chloride: 101 mmol/L (ref 98–110)
Creat: 0.7 mg/dL (ref 0.50–1.10)
Glucose, Bld: 88 mg/dL (ref 65–99)
Potassium: 3.4 mmol/L — ABNORMAL LOW (ref 3.5–5.3)
Sodium: 136 mmol/L (ref 135–146)

## 2019-02-09 ENCOUNTER — Other Ambulatory Visit: Payer: Self-pay | Admitting: *Deleted

## 2019-02-09 MED ORDER — HYDROCHLOROTHIAZIDE 25 MG PO TABS: 25.0000 mg | ORAL_TABLET | Freq: Every day | ORAL | 6 refills | Status: DC

## 2019-02-09 MED ORDER — POTASSIUM CHLORIDE CRYS ER 20 MEQ PO TBCR: 20.0000 meq | EXTENDED_RELEASE_TABLET | Freq: Every day | ORAL | 0 refills | Status: DC

## 2019-02-16 ENCOUNTER — Ambulatory Visit (INDEPENDENT_AMBULATORY_CARE_PROVIDER_SITE_OTHER): Payer: No Typology Code available for payment source | Admitting: Family Medicine

## 2019-02-16 ENCOUNTER — Encounter: Payer: Self-pay | Admitting: Family Medicine

## 2019-02-16 VITALS — BP 128/72 | HR 98 | Temp 97.8°F | Resp 14 | Ht 66.0 in | Wt 281.0 lb

## 2019-02-16 DIAGNOSIS — L03115 Cellulitis of right lower limb: Secondary | ICD-10-CM | POA: Diagnosis not present

## 2019-02-16 DIAGNOSIS — L02415 Cutaneous abscess of right lower limb: Secondary | ICD-10-CM | POA: Diagnosis not present

## 2019-02-16 MED ORDER — FLUCONAZOLE 150 MG PO TABS: ORAL_TABLET | ORAL | 1 refills | Status: DC

## 2019-02-16 MED ORDER — SULFAMETHOXAZOLE-TRIMETHOPRIM 800-160 MG PO TABS: 1.0000 | ORAL_TABLET | Freq: Two times a day (BID) | ORAL | 0 refills | Status: DC

## 2019-02-16 NOTE — Patient Instructions (Addendum)
Take antibotics as prescribed  F/U as needed

## 2019-02-16 NOTE — Progress Notes (Signed)
   Subjective:    Patient ID: Elizabeth Harper, female    DOB: 03-08-1971, 48 y.o.   MRN: [id number]  Patient presents for Reaction to Insect Bite (noted on 02/15/2019- hard knot with redness around that had hard white center- area was burst with pus that drained)  Patient here with small abscess to her right lower leg.  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  She is not sure she nicked herself or she was bitten by something.  Yesterday she noted a pimple-like lesion to her right lower leg her daughter was able to squeeze it and it popped and pus drained out.  She now has some redness in her circle lesion around the area.  It is also quite tender.  She has not had any fever chills myalgias.   Review Of Systems:  GEN- denies fatigue, fever, weight loss,weakness, recent illness HEENT- denies eye drainage, change in vision, nasal discharge, CVS- denies chest pain, palpitations RESP- denies SOB, cough, wheeze ABD- denies N/V, change in stools, abd pain GU- denies dysuria, hematuria, dribbling, incontinence MSK- denies joint pain, muscle aches, injury Neuro- denies headache, dizziness, syncope, seizure activity       Objective:    BP 128/72   Pulse 98   Temp 97.8 F (36.6 C) (Oral)   Resp 14   Ht 5\' 6"  (1.676 m)   Wt 281 lb (127.5 kg)   LMP 01/23/2019 Comment: regular  SpO2 99%   BMI 45.35 kg/m  GEN- NAD, alert and oriented x3 Skin- Right lateral leg small abscess with pustule at center with induration 1-2 cm surrounding and then  2-3 cm erythema extending from center,  TTP, and warmth Ext- trace edema bilat ankles , DP 2+   Procedure- Incision and Drainage Procedure explained to patient questions answered benefits and risks discussed verbal consent obtained. Antiseptic-Betadine Anesthesia-lidocaine 1% with epi 1cc Incision performed small amount of pus expressed Culture taken Minimal blood loss Patient tolerated procedure  well Bandage applied      Assessment & Plan:      Problem List Items Addressed This Visit    None    Visit Diagnoses    Cellulitis and abscess of right lower extremity    -  Primary   S/P I & D in office, culture taken, start bactrim DS BID , warm compresses, discussed wound care, pt nurse  diflucan due to yeast infections after antibiotics    Relevant Orders   WOUND CULTURE      Note: This dictation was prepared with Dragon dictation along with smaller phrase technology. Any transcriptional errors that result from this process are unintentional.

## 2019-02-19 LAB — WOUND CULTURE
MICRO NUMBER:: 789438
SPECIMEN QUALITY:: ADEQUATE

## 2019-02-21 ENCOUNTER — Other Ambulatory Visit: Payer: Self-pay | Admitting: *Deleted

## 2019-02-21 ENCOUNTER — Other Ambulatory Visit: Payer: Self-pay

## 2019-02-21 MED ORDER — SULFAMETHOXAZOLE-TRIMETHOPRIM 800-160 MG PO TABS: 1.0000 | ORAL_TABLET | Freq: Two times a day (BID) | ORAL | 0 refills | Status: DC

## 2019-02-21 MED ORDER — MUPIROCIN 2 % EX OINT: 1.0000 "application " | TOPICAL_OINTMENT | Freq: Two times a day (BID) | CUTANEOUS | 0 refills | Status: DC

## 2019-03-25 ENCOUNTER — Other Ambulatory Visit: Payer: Self-pay | Admitting: Family Medicine

## 2019-03-28 ENCOUNTER — Other Ambulatory Visit (HOSPITAL_COMMUNITY): Payer: Self-pay | Admitting: Family Medicine

## 2019-03-28 DIAGNOSIS — Z1231 Encounter for screening mammogram for malignant neoplasm of breast: Secondary | ICD-10-CM

## 2019-03-29 ENCOUNTER — Ambulatory Visit (INDEPENDENT_AMBULATORY_CARE_PROVIDER_SITE_OTHER): Payer: No Typology Code available for payment source | Admitting: Family Medicine

## 2019-03-29 ENCOUNTER — Other Ambulatory Visit: Payer: Self-pay

## 2019-03-29 ENCOUNTER — Encounter: Payer: Self-pay | Admitting: Family Medicine

## 2019-03-29 VITALS — BP 132/80 | HR 80 | Temp 98.1°F | Resp 16 | Ht 66.0 in | Wt 280.0 lb

## 2019-03-29 DIAGNOSIS — A6004 Herpesviral vulvovaginitis: Secondary | ICD-10-CM

## 2019-03-29 DIAGNOSIS — Z113 Encounter for screening for infections with a predominantly sexual mode of transmission: Secondary | ICD-10-CM

## 2019-03-29 DIAGNOSIS — R300 Dysuria: Secondary | ICD-10-CM

## 2019-03-29 LAB — URINALYSIS, ROUTINE W REFLEX MICROSCOPIC
Bilirubin Urine: NEGATIVE
Glucose, UA: NEGATIVE
Hyaline Cast: NONE SEEN /LPF
Ketones, ur: NEGATIVE
Nitrite: NEGATIVE
Specific Gravity, Urine: 1.028 (ref 1.001–1.03)
pH: 6 (ref 5.0–8.0)

## 2019-03-29 LAB — WET PREP FOR TRICH, YEAST, CLUE

## 2019-03-29 LAB — MICROSCOPIC MESSAGE

## 2019-03-29 MED ORDER — VALACYCLOVIR HCL 1 G PO TABS: 1000.0000 mg | ORAL_TABLET | Freq: Two times a day (BID) | ORAL | 0 refills | Status: DC

## 2019-03-29 NOTE — Patient Instructions (Addendum)
Start medication as prescribed We will call with results  F/U pending results

## 2019-03-29 NOTE — Progress Notes (Signed)
Subjective:    Patient ID: Elizabeth Harper, female    DOB: [birth year], 48 y.o.   MRN: [id number]  Patient presents for Dysuria (burning with urination, pain)  Patient here with sores in the vaginal region for the past week or so.  She is also had burning with urination feels like everything is on fire stinging and burning.  [place] the beach [place] ago states that she was in a hot tub.  She has not had any significant vaginal discharge.  She did call for ED visit over the weekend they gave her Bactrim to cover for UTI but she states that everything is swollen and painful in the vaginal area she feels some bumps.  She did sit in a bath with Clorox which actually caused more burning.  She has never had any outbreaks before.  She does have a positive HSV from 2017.  As we were reviewing her history and her husband's history.  She states that he did tell her he had gonorrhea years ago that was treated she then remembers there was a spot or something on his penile shaft about a month ago but it went away on its own.        Review Of Systems:  GEN- denies fatigue, fever, weight loss,weakness, recent illness HEENT- denies eye drainage, change in vision, nasal discharge, CVS- denies chest pain, palpitations RESP- denies SOB, cough, wheeze ABD- denies N/V, change in stools, abd pain GU- denies dysuria, hematuria, dribbling, incontinence MSK- denies joint pain, muscle aches, injury Neuro- denies headache, dizziness, syncope, seizure activity       Objective:    BP 132/80   Pulse 80   Temp 98.1 F (36.7 C) (Oral)   Resp 16   Ht 5\' 6"  (1.676 m)   Wt 280 lb (127 kg)   SpO2 99%   BMI 45.19 kg/m  GEN- NAD, alert and oriented x3 RESP-CTAB ABD-NABS,soft,NT,ND GU- normal external genitalia anatomy, vaginal mucosa pink and moist, cervix visualized no growth, no blood form os, minimal thin clear discharge, multiple small ulceration over vulva, between labia folds and at introitus,  multiple small blistering lesions on buttocks and cleft, and vulva, TTP        Assessment & Plan:      Problem List Items Addressed This Visit    None    Visit Diagnoses    Dysuria    -  Primary   Relevant Orders   Urinalysis, Routine w reflex microscopic (Completed)   Urine Culture   Herpes simplex vulvovaginitis       Based on presentation HSV , will start valtrex, culture taken of lesion and blood drawn, as well as other STD, Urine culture also sent but suspect burning from urine over the sores   Relevant Medications   valACYclovir (VALTREX) 1000 MG tablet   Other Relevant Orders   CBC with Differential/Platelet   C. trachomatis/N. gonorrhoeae RNA   WET PREP FOR [place], YEAST, CLUE (Completed)   Herpes simplex virus culture   HIV Antibody (routine testing w rflx)   HSV(herpes simplex vrs) 1+2 ab-IgG   Screen for STD (sexually transmitted disease)       Relevant Orders   C. trachomatis/N. gonorrhoeae RNA   WET PREP FOR [place], YEAST, CLUE (Completed)   Herpes simplex virus culture   HIV Antibody (routine testing w rflx)   HSV(herpes simplex vrs) 1+2 ab-IgG      Note: This dictation was prepared with Viviann Spare  dictation along with smaller phrase technology. Any transcriptional errors that result from this process are unintentional.

## 2019-03-30 LAB — CBC WITH DIFFERENTIAL/PLATELET
Absolute Monocytes: 420 cells/uL (ref 200–950)
Basophils Absolute: 28 cells/uL (ref 0–200)
Basophils Relative: 0.4 %
Eosinophils Absolute: 182 cells/uL (ref 15–500)
Eosinophils Relative: 2.6 %
HCT: 34.3 % — ABNORMAL LOW (ref 35.0–45.0)
Hemoglobin: 10.8 g/dL — ABNORMAL LOW (ref 11.7–15.5)
Lymphs Abs: 2128 cells/uL (ref 850–3900)
MCH: 21.5 pg — ABNORMAL LOW (ref 27.0–33.0)
MCHC: 31.5 g/dL — ABNORMAL LOW (ref 32.0–36.0)
MCV: 68.3 fL — ABNORMAL LOW (ref 80.0–100.0)
MPV: 10.1 fL (ref 7.5–12.5)
Monocytes Relative: 6 %
Neutro Abs: 4242 cells/uL (ref 1500–7800)
Neutrophils Relative %: 60.6 %
Platelets: 319 10*3/uL (ref 140–400)
RBC: 5.02 10*6/uL (ref 3.80–5.10)
RDW: 15.8 % — ABNORMAL HIGH (ref 11.0–15.0)
Total Lymphocyte: 30.4 %
WBC: 7 10*3/uL (ref 3.8–10.8)

## 2019-03-30 LAB — C. TRACHOMATIS/N. GONORRHOEAE RNA
C. trachomatis RNA, TMA: NOT DETECTED
N. gonorrhoeae RNA, TMA: NOT DETECTED

## 2019-03-30 LAB — HSV(HERPES SIMPLEX VRS) I + II AB-IGG
HAV 1 IGG,TYPE SPECIFIC AB: 13.4 index — ABNORMAL HIGH
HSV 2 IGG,TYPE SPECIFIC AB: 0.95 index — ABNORMAL HIGH

## 2019-03-30 LAB — HIV ANTIBODY (ROUTINE TESTING W REFLEX): HIV 1&2 Ab, 4th Generation: NONREACTIVE

## 2019-03-31 ENCOUNTER — Other Ambulatory Visit: Payer: Self-pay

## 2019-03-31 ENCOUNTER — Ambulatory Visit (HOSPITAL_COMMUNITY)
Admission: RE | Admit: 2019-03-31 | Discharge: 2019-03-31 | Disposition: A | Discharge status: Home / Self Care | Payer: No Typology Code available for payment source | Source: Ambulatory Visit | Attending: Family Medicine | Admitting: Family Medicine

## 2019-03-31 DIAGNOSIS — Z1231 Encounter for screening mammogram for malignant neoplasm of breast: Secondary | ICD-10-CM | POA: Diagnosis not present

## 2019-03-31 LAB — URINE CULTURE
MICRO NUMBER:: 934355
SPECIMEN QUALITY:: ADEQUATE

## 2019-03-31 LAB — HERPES SIMPLEX VIRUS CULTURE
MICRO NUMBER:: 934686
SPECIMEN QUALITY:: ADEQUATE

## 2019-04-01 ENCOUNTER — Encounter: Payer: No Typology Code available for payment source | Admitting: Family Medicine

## 2019-04-05 ENCOUNTER — Other Ambulatory Visit: Payer: Self-pay | Admitting: Family Medicine

## 2019-04-07 ENCOUNTER — Other Ambulatory Visit: Payer: Self-pay | Admitting: *Deleted

## 2019-04-07 MED ORDER — NYSTATIN 100000 UNIT/GM EX CREA: 1.0000 "application " | TOPICAL_CREAM | Freq: Two times a day (BID) | CUTANEOUS | 0 refills | Status: DC

## 2019-04-07 MED ORDER — FLUCONAZOLE 150 MG PO TABS: 150.0000 mg | ORAL_TABLET | Freq: Once | ORAL | 0 refills | Status: AC

## 2019-04-20 ENCOUNTER — Other Ambulatory Visit: Payer: Self-pay | Admitting: *Deleted

## 2019-04-20 MED ORDER — VALACYCLOVIR HCL 1 G PO TABS: 1000.0000 mg | ORAL_TABLET | Freq: Two times a day (BID) | ORAL | 0 refills | Status: DC

## 2019-07-05 ENCOUNTER — Other Ambulatory Visit: Payer: Self-pay | Admitting: *Deleted

## 2019-07-05 MED ORDER — VALACYCLOVIR HCL 1 G PO TABS: 1000.0000 mg | ORAL_TABLET | Freq: Two times a day (BID) | ORAL | 0 refills | Status: DC

## 2019-07-06 ENCOUNTER — Telehealth (INDEPENDENT_AMBULATORY_CARE_PROVIDER_SITE_OTHER): Payer: No Typology Code available for payment source | Admitting: Family Medicine

## 2019-07-06 ENCOUNTER — Encounter: Payer: Self-pay | Admitting: Family Medicine

## 2019-07-06 DIAGNOSIS — Z8614 Personal history of Methicillin resistant Staphylococcus aureus infection: Secondary | ICD-10-CM

## 2019-07-06 DIAGNOSIS — L02414 Cutaneous abscess of left upper limb: Secondary | ICD-10-CM | POA: Diagnosis not present

## 2019-07-06 MED ORDER — SULFAMETHOXAZOLE-TRIMETHOPRIM 800-160 MG PO TABS: 1.0000 | ORAL_TABLET | Freq: Two times a day (BID) | ORAL | 0 refills | Status: DC

## 2019-07-06 MED ORDER — FLUCONAZOLE 150 MG PO TABS: 150.0000 mg | ORAL_TABLET | Freq: Once | ORAL | 1 refills | Status: AC

## 2019-07-06 MED ORDER — MUPIROCIN 2 % EX OINT: 1.0000 "application " | TOPICAL_OINTMENT | Freq: Two times a day (BID) | CUTANEOUS | 0 refills | Status: DC

## 2019-07-06 MED ORDER — CHLORHEXIDINE GLUCONATE 4 % EX LIQD: Freq: Every day | CUTANEOUS | 1 refills | Status: DC | PRN

## 2019-07-06 NOTE — Progress Notes (Signed)
Virtual Visit via Video Note  I connected with Elizabeth Harper on 07/06/19 at 10: 03 AM EST by a video enabled telemedicine application and verified that I am speaking with the correct person using two identifiers.     Pt location: at home   Physician location:  In office, [facility], Vic Blackbird MD     On call: patient and physician   I discussed the limitations of evaluation and management by telemedicine and the availability of in person appointments. The patient expressed understanding and agreed to proceed.  History of Present Illness: Patient has had lesion on her left forearm just above the wrist for the past 3 days.  She is actually had MRSA before where she had a lesion pop up on her leg back in August.  She does not recall being bitten has not had any injury did not have any scratch or lesion there before.  States that it is quite tender especially around the red area.  She does not have any swelling of the actual wrist joint.  There is a pustule at the center.  She has not had any fever or chills.   Observations/Objective: GRainey video but able to identify erythematous raised lesion with pustule in the center approximately nickel size.  Full range of motion of the wrist noted over video  Assessment and Plan: Abscess of left upper extremity with history of MRSA.  She also works in a Manufacturing engineer.  I will recommend that she start Bactrim double strength for 10 days.  Use warm compress expect since there is already a pustule that it will drain on its own.  She will monitor for any progression of the infection while on antibiotics or if there is no response she will need I&D.  We will also give her Bactroban to the nares again with known history of MRSA.  Diflucan as she gets yeast infections after antibiotics.  To prevent these infections will give her Hibiclens to use over the skin  Follow Up Instructions:    I discussed the assessment and treatment plan  with the patient. The patient was provided an opportunity to ask questions and all were answered. The patient agreed with the plan and demonstrated an understanding of the instructions.   The patient was advised to call back or seek an in-person evaluation if the symptoms worsen or if the condition fails to improve as anticipated.  I provided 10 minutes of non-face-to-face time during this encounter. End Time 10:13am   Vic Blackbird, MD

## 2019-08-01 ENCOUNTER — Other Ambulatory Visit: Payer: Self-pay | Admitting: *Deleted

## 2019-08-01 MED ORDER — HYDROCHLOROTHIAZIDE 25 MG PO TABS: 25.0000 mg | ORAL_TABLET | Freq: Every day | ORAL | 1 refills | Status: DC

## 2019-08-01 MED ORDER — PHENTERMINE HCL 37.5 MG PO TABS: 37.5000 mg | ORAL_TABLET | Freq: Every day | ORAL | 1 refills | Status: DC

## 2019-08-01 NOTE — Telephone Encounter (Signed)
Received call from patient.   Reports that she recently resumed taking Phentermine consistently in January and would like refill.   Ok to refill?

## 2019-08-01 NOTE — Telephone Encounter (Signed)
Will give 1 refill, need OV in 1 month

## 2019-08-01 NOTE — Telephone Encounter (Signed)
See below

## 2019-09-06 ENCOUNTER — Other Ambulatory Visit: Payer: Self-pay | Admitting: Family Medicine

## 2019-09-22 ENCOUNTER — Encounter (HOSPITAL_COMMUNITY): Payer: Self-pay | Admitting: Radiology

## 2019-09-22 ENCOUNTER — Emergency Department (HOSPITAL_COMMUNITY): Payer: No Typology Code available for payment source

## 2019-09-22 ENCOUNTER — Emergency Department (HOSPITAL_COMMUNITY)
Admission: EM | Admit: 2019-09-22 | Discharge: 2019-09-22 | Disposition: A | Discharge status: Home / Self Care | Payer: No Typology Code available for payment source | Attending: Emergency Medicine | Admitting: Emergency Medicine

## 2019-09-22 DIAGNOSIS — Y92410 Unspecified street and highway as the place of occurrence of the external cause: Secondary | ICD-10-CM | POA: Diagnosis not present

## 2019-09-22 DIAGNOSIS — Y999 Unspecified external cause status: Secondary | ICD-10-CM | POA: Diagnosis not present

## 2019-09-22 DIAGNOSIS — S301XXA Contusion of abdominal wall, initial encounter: Secondary | ICD-10-CM | POA: Insufficient documentation

## 2019-09-22 DIAGNOSIS — N200 Calculus of kidney: Secondary | ICD-10-CM | POA: Insufficient documentation

## 2019-09-22 DIAGNOSIS — Z79899 Other long term (current) drug therapy: Secondary | ICD-10-CM | POA: Diagnosis not present

## 2019-09-22 DIAGNOSIS — S3991XA Unspecified injury of abdomen, initial encounter: Secondary | ICD-10-CM | POA: Diagnosis present

## 2019-09-22 DIAGNOSIS — R079 Chest pain, unspecified: Secondary | ICD-10-CM

## 2019-09-22 DIAGNOSIS — S322XXA Fracture of coccyx, initial encounter for closed fracture: Secondary | ICD-10-CM | POA: Insufficient documentation

## 2019-09-22 DIAGNOSIS — Z91040 Latex allergy status: Secondary | ICD-10-CM | POA: Diagnosis not present

## 2019-09-22 DIAGNOSIS — R4182 Altered mental status, unspecified: Secondary | ICD-10-CM | POA: Insufficient documentation

## 2019-09-22 DIAGNOSIS — I10 Essential (primary) hypertension: Secondary | ICD-10-CM | POA: Diagnosis not present

## 2019-09-22 DIAGNOSIS — Y93I9 Activity, other involving external motion: Secondary | ICD-10-CM | POA: Insufficient documentation

## 2019-09-22 LAB — COMPREHENSIVE METABOLIC PANEL
ALT: 28 U/L (ref 0–44)
AST: 28 U/L (ref 15–41)
Albumin: 3.4 g/dL — ABNORMAL LOW (ref 3.5–5.0)
Alkaline Phosphatase: 93 U/L (ref 38–126)
Anion gap: 10 (ref 5–15)
BUN: 15 mg/dL (ref 6–20)
CO2: 21 mmol/L — ABNORMAL LOW (ref 22–32)
Calcium: 8.8 mg/dL — ABNORMAL LOW (ref 8.9–10.3)
Chloride: 104 mmol/L (ref 98–111)
Creatinine, Ser: 0.87 mg/dL (ref 0.44–1.00)
GFR calc Af Amer: >60 mL/min (ref >60)
GFR calc non Af Amer: >60 mL/min (ref >60)
Glucose, Bld: 145 mg/dL — ABNORMAL HIGH (ref 70–99)
Potassium: 3.2 mmol/L — ABNORMAL LOW (ref 3.5–5.1)
Sodium: 135 mmol/L (ref 135–145)
Total Bilirubin: 0.5 mg/dL (ref 0.3–1.2)
Total Protein: 7.5 g/dL (ref 6.5–8.1)

## 2019-09-22 LAB — CBC WITH DIFFERENTIAL/PLATELET
Abs Immature Granulocytes: 0.11 10*3/uL — ABNORMAL HIGH (ref 0.00–0.07)
Basophils Absolute: 0 10*3/uL (ref 0.0–0.1)
Basophils Relative: 0 %
Eosinophils Absolute: 0.1 10*3/uL (ref 0.0–0.5)
Eosinophils Relative: 0 %
HCT: 35.4 % — ABNORMAL LOW (ref 36.0–46.0)
Hemoglobin: 10.9 g/dL — ABNORMAL LOW (ref 12.0–15.0)
Immature Granulocytes: 1 %
Lymphocytes Relative: 17 %
Lymphs Abs: 2.5 10*3/uL (ref 0.7–4.0)
MCH: 21.9 pg — ABNORMAL LOW (ref 26.0–34.0)
MCHC: 30.8 g/dL (ref 30.0–36.0)
MCV: 71.2 fL — ABNORMAL LOW (ref 80.0–100.0)
Monocytes Absolute: 0.5 10*3/uL (ref 0.1–1.0)
Monocytes Relative: 3 %
Neutro Abs: 11.4 10*3/uL — ABNORMAL HIGH (ref 1.7–7.7)
Neutrophils Relative %: 79 %
Platelets: 437 10*3/uL — ABNORMAL HIGH (ref 150–400)
RBC: 4.97 MIL/uL (ref 3.87–5.11)
RDW: 16.6 % — ABNORMAL HIGH (ref 11.5–15.5)
WBC: 14.6 10*3/uL — ABNORMAL HIGH (ref 4.0–10.5)
nRBC: 0 % (ref 0.0–0.2)

## 2019-09-22 LAB — I-STAT BETA HCG BLOOD, ED (MC, WL, AP ONLY): I-stat hCG, quantitative: <5 m[IU]/mL (ref <5)

## 2019-09-22 LAB — CBG MONITORING, ED: Glucose-Capillary: 103 mg/dL — ABNORMAL HIGH (ref 70–99)

## 2019-09-22 MED ORDER — OXYCODONE-ACETAMINOPHEN 5-325 MG PO TABS: 1.0000 | ORAL_TABLET | Freq: Four times a day (QID) | ORAL | 0 refills | Status: DC | PRN

## 2019-09-22 MED ORDER — FENTANYL CITRATE (PF) 100 MCG/2ML IJ SOLN
50.0000 ug | Freq: Once | INTRAMUSCULAR | Status: AC
  Administered 2019-09-22: 20:00:00 50 ug via INTRAVENOUS
  Filled 2019-09-22: qty 2

## 2019-09-22 MED ORDER — FENTANYL CITRATE (PF) 100 MCG/2ML IJ SOLN
50.0000 ug | Freq: Once | INTRAMUSCULAR | Status: AC
  Administered 2019-09-22: 50 ug via INTRAVENOUS
  Filled 2019-09-22: qty 2

## 2019-09-22 MED ORDER — OXYCODONE-ACETAMINOPHEN 5-325 MG PO TABS
1.0000 | ORAL_TABLET | Freq: Once | ORAL | Status: AC
  Administered 2019-09-22: 1 via ORAL
  Filled 2019-09-22: qty 1

## 2019-09-22 NOTE — ED Notes (Signed)
ED Provider at bedside. Tamala Julian, NP

## 2019-09-22 NOTE — ED Notes (Signed)
Pt cleared from C- collar as C-spine CT was negative for acute fracture. Pt given water per request. Pt aware of need for urine.
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
## 2019-09-22 NOTE — ED Notes (Signed)
Pt given ring cushion per EDP request to take home and use for comfort.

## 2019-09-22 NOTE — ED Notes (Signed)
Pt says she tried to stand up and got very weak, spouse was at bedside. Pt denies any dizziness. Nurse was not present or notified prior to pt attempting to stand. Informed pt to let staff know when she wanted to get up and attempt to walk or stand, as she was at risk for falling. Pt verbalized understanding.   Pt appears very drowsy at this time. Pt eyes closed during through conversation. Pt spouse remains at bedside.

## 2019-09-22 NOTE — Progress Notes (Signed)
Pt showing regular LOC at this time. Recognizes this Therapist, sports. Continues to c/o pain to left side and neck. Husband at bedside.

## 2019-09-22 NOTE — Progress Notes (Signed)
Necklace removed during CT and given to patients husband, Thayer Jew, per pt request.

## 2019-09-22 NOTE — Progress Notes (Addendum)
Pt showing some altered LOC at this time. Delayed responses, very tearful, pulling at neck brace. C/O abdominal pain worse on the left side but guarding to the right, and neck pain. Patient did not recognize this RN, whom she has known for 18 yrs, initially.

## 2019-09-22 NOTE — Discharge Instructions (Addendum)
Please refer to the attached instructions. You have been provided a pre-pack of oxycodone--one tablet every 6 hours as needed for severe pain. You have an additional prescription at your pharmacy. Please follow-up with your primary care provider.
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
## 2019-09-23 ENCOUNTER — Other Ambulatory Visit: Payer: Self-pay | Admitting: Family Medicine

## 2019-09-23 MED FILL — Oxycodone w/ Acetaminophen Tab 5-325 MG: ORAL | Qty: 6 | Status: AC

## 2019-09-26 ENCOUNTER — Telehealth: Payer: Self-pay | Admitting: *Deleted
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
## 2019-09-27 ENCOUNTER — Ambulatory Visit (INDEPENDENT_AMBULATORY_CARE_PROVIDER_SITE_OTHER): Payer: No Typology Code available for payment source | Admitting: Nurse Practitioner

## 2019-09-27 ENCOUNTER — Other Ambulatory Visit: Payer: Self-pay

## 2019-09-27 DIAGNOSIS — R52 Pain, unspecified: Secondary | ICD-10-CM | POA: Diagnosis not present

## 2019-09-27 MED ORDER — OXYCODONE-ACETAMINOPHEN 5-325 MG PO TABS: 1.0000 | ORAL_TABLET | Freq: Three times a day (TID) | ORAL | 0 refills | Status: AC | PRN

## 2019-09-27 MED ORDER — KETOROLAC TROMETHAMINE 60 MG/2ML IM SOLN
60.0000 mg | Freq: Once | INTRAMUSCULAR | Status: AC
  Administered 2019-09-27: 60 mg via INTRAMUSCULAR
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
## 2019-10-03 ENCOUNTER — Ambulatory Visit: Payer: Self-pay | Admitting: Orthopaedic Surgery

## 2019-10-06 ENCOUNTER — Other Ambulatory Visit: Payer: Self-pay

## 2019-10-06 ENCOUNTER — Encounter: Payer: Self-pay | Admitting: Orthopaedic Surgery

## 2019-10-06 ENCOUNTER — Ambulatory Visit (INDEPENDENT_AMBULATORY_CARE_PROVIDER_SITE_OTHER): Payer: Self-pay | Admitting: Orthopaedic Surgery

## 2019-10-06 DIAGNOSIS — S322XXA Fracture of coccyx, initial encounter for closed fracture: Secondary | ICD-10-CM | POA: Insufficient documentation

## 2019-10-06 DIAGNOSIS — S3210XA Unspecified fracture of sacrum, initial encounter for closed fracture: Secondary | ICD-10-CM
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
## 2019-10-20 ENCOUNTER — Other Ambulatory Visit: Payer: Self-pay | Admitting: Family Medicine

## 2019-10-20 NOTE — Telephone Encounter (Signed)
Ok to refill??  Last office visit 09/27/2019.  Last refill 08/01/2019, #1 refill.

## 2019-10-26 ENCOUNTER — Encounter: Payer: Self-pay | Admitting: Family Medicine

## 2019-10-26 ENCOUNTER — Ambulatory Visit (INDEPENDENT_AMBULATORY_CARE_PROVIDER_SITE_OTHER): Payer: No Typology Code available for payment source | Admitting: Family Medicine

## 2019-10-26 ENCOUNTER — Other Ambulatory Visit: Payer: Self-pay

## 2019-10-26 VITALS — BP 128/74 | HR 82 | Temp 98.0°F | Resp 14 | Ht 66.0 in | Wt 286.0 lb

## 2019-10-26 DIAGNOSIS — E876 Hypokalemia: Secondary | ICD-10-CM

## 2019-10-26 DIAGNOSIS — R1032 Left lower quadrant pain: Secondary | ICD-10-CM

## 2019-10-26 DIAGNOSIS — S3210XD Unspecified fracture of sacrum, subsequent encounter for fracture with routine healing: Secondary | ICD-10-CM | POA: Diagnosis not present

## 2019-10-26 DIAGNOSIS — M5030 Other cervical disc degeneration, unspecified cervical region: Secondary | ICD-10-CM

## 2019-10-26 DIAGNOSIS — D72829 Elevated white blood cell count, unspecified: Secondary | ICD-10-CM | POA: Diagnosis not present

## 2019-10-26 DIAGNOSIS — S301XXD Contusion of abdominal wall, subsequent encounter: Secondary | ICD-10-CM

## 2019-10-26 DIAGNOSIS — M545 Low back pain, unspecified: Secondary | ICD-10-CM

## 2019-10-26 DIAGNOSIS — S322XXD Fracture of coccyx, subsequent encounter for fracture with routine healing: Secondary | ICD-10-CM

## 2019-10-26 LAB — COMPREHENSIVE METABOLIC PANEL
AG Ratio: 1.2 (calc) (ref 1.0–2.5)
ALT: 13 U/L (ref 6–29)
AST: 8 U/L — ABNORMAL LOW (ref 10–35)
Albumin: 3.7 g/dL (ref 3.6–5.1)
Alkaline phosphatase (APISO): 87 U/L (ref 31–125)
BUN: 10 mg/dL (ref 7–25)
CO2: 25 mmol/L (ref 20–32)
Calcium: 9.1 mg/dL (ref 8.6–10.2)
Chloride: 104 mmol/L (ref 98–110)
Creat: 0.59 mg/dL (ref 0.50–1.10)
Globulin: 3.1 g/dL (calc) (ref 1.9–3.7)
Glucose, Bld: 97 mg/dL (ref 65–99)
Potassium: 4.2 mmol/L (ref 3.5–5.3)
Sodium: 137 mmol/L (ref 135–146)
Total Bilirubin: 0.3 mg/dL (ref 0.2–1.2)
Total Protein: 6.8 g/dL (ref 6.1–8.1)

## 2019-10-26 LAB — CBC WITH DIFFERENTIAL/PLATELET
Absolute Monocytes: 372 cells/uL (ref 200–950)
Basophils Absolute: 22 cells/uL (ref 0–200)
Basophils Relative: 0.3 %
Eosinophils Absolute: 80 cells/uL (ref 15–500)
Eosinophils Relative: 1.1 %
HCT: 35.5 % (ref 35.0–45.0)
Hemoglobin: 10.9 g/dL — ABNORMAL LOW (ref 11.7–15.5)
Lymphs Abs: 1737 cells/uL (ref 850–3900)
MCH: 22 pg — ABNORMAL LOW (ref 27.0–33.0)
MCHC: 30.7 g/dL — ABNORMAL LOW (ref 32.0–36.0)
MCV: 71.6 fL — ABNORMAL LOW (ref 80.0–100.0)
MPV: 10 fL (ref 7.5–12.5)
Monocytes Relative: 5.1 %
Neutro Abs: 5088 cells/uL (ref 1500–7800)
Neutrophils Relative %: 69.7 %
Platelets: 367 10*3/uL (ref 140–400)
RBC: 4.96 10*6/uL (ref 3.80–5.10)
RDW: 16.9 % — ABNORMAL HIGH (ref 11.0–15.0)
Total Lymphocyte: 23.8 %
WBC: 7.3 10*3/uL (ref 3.8–10.8)

## 2019-10-26 MED ORDER — METHOCARBAMOL 500 MG PO TABS: 500.0000 mg | ORAL_TABLET | Freq: Three times a day (TID) | ORAL | 1 refills | Status: DC | PRN

## 2019-10-26 MED ORDER — IBUPROFEN 800 MG PO TABS: 800.0000 mg | ORAL_TABLET | Freq: Three times a day (TID) | ORAL | 1 refills | Status: DC | PRN

## 2019-10-26 NOTE — Patient Instructions (Signed)
CT abdomen pelvis  Call and FMLA faxed to me  F/U 4 weeks
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
## 2019-10-26 NOTE — Assessment & Plan Note (Signed)
Noted on imaging, has some spasm, wants to d/c oxycodone due to SE makes her very sleepy. Ibuprofen 800mg , robaxin given

## 2019-10-27 ENCOUNTER — Ambulatory Visit (HOSPITAL_COMMUNITY)
Admission: RE | Admit: 2019-10-27 | Discharge: 2019-10-27 | Disposition: A | Discharge status: Home / Self Care | Payer: No Typology Code available for payment source | Source: Ambulatory Visit | Attending: Family Medicine | Admitting: Family Medicine

## 2019-10-27 DIAGNOSIS — N200 Calculus of kidney: Secondary | ICD-10-CM | POA: Diagnosis not present

## 2019-10-27 DIAGNOSIS — S301XXD Contusion of abdominal wall, subsequent encounter: Secondary | ICD-10-CM | POA: Insufficient documentation

## 2019-10-27 DIAGNOSIS — Y939 Activity, unspecified: Secondary | ICD-10-CM | POA: Insufficient documentation

## 2019-10-27 DIAGNOSIS — Y929 Unspecified place or not applicable: Secondary | ICD-10-CM | POA: Insufficient documentation

## 2019-10-27 DIAGNOSIS — Y999 Unspecified external cause status: Secondary | ICD-10-CM | POA: Insufficient documentation

## 2019-10-27 DIAGNOSIS — D259 Leiomyoma of uterus, unspecified: Secondary | ICD-10-CM | POA: Diagnosis not present

## 2019-10-27 DIAGNOSIS — K8020 Calculus of gallbladder without cholecystitis without obstruction: Secondary | ICD-10-CM | POA: Diagnosis not present

## 2019-10-27 DIAGNOSIS — Y92410 Unspecified street and highway as the place of occurrence of the external cause: Secondary | ICD-10-CM | POA: Diagnosis not present

## 2019-10-27 DIAGNOSIS — R609 Edema, unspecified: Secondary | ICD-10-CM | POA: Insufficient documentation

## 2019-10-27 DIAGNOSIS — R1032 Left lower quadrant pain: Secondary | ICD-10-CM | POA: Insufficient documentation

## 2019-10-27 DIAGNOSIS — M7989 Other specified soft tissue disorders: Secondary | ICD-10-CM | POA: Insufficient documentation

## 2019-10-27 DIAGNOSIS — D72829 Elevated white blood cell count, unspecified: Secondary | ICD-10-CM | POA: Insufficient documentation

## 2019-10-27 MED ORDER — IOHEXOL 9 MG/ML PO SOLN
ORAL | Status: AC
  Filled 2019-10-27: qty 1000

## 2019-10-27 MED ORDER — BARIUM SULFATE 2.1 % PO SUSP
ORAL | Status: AC
  Filled 2019-10-27: qty 2

## 2019-10-28 ENCOUNTER — Telehealth: Payer: Self-pay | Admitting: Family Medicine

## 2019-10-28 DIAGNOSIS — S300XXD Contusion of lower back and pelvis, subsequent encounter: Secondary | ICD-10-CM

## 2019-10-28 DIAGNOSIS — S301XXD Contusion of abdominal wall, subsequent encounter: Secondary | ICD-10-CM

## 2019-10-28 DIAGNOSIS — S322XXD Fracture of coccyx, subsequent encounter for fracture with routine healing: Secondary | ICD-10-CM

## 2019-10-28 DIAGNOSIS — S3210XD Unspecified fracture of sacrum, subsequent encounter for fracture with routine healing: Secondary | ICD-10-CM
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
## 2019-10-31 ENCOUNTER — Other Ambulatory Visit: Payer: Self-pay | Admitting: General Surgery

## 2019-11-01 ENCOUNTER — Telehealth: Payer: Self-pay | Admitting: Family Medicine

## 2019-11-01 NOTE — Telephone Encounter (Signed)
##  701-007-9076 Would like to know if Dr. Delilah Shan out her forms to be sent to Douglas Gardens Hospital

## 2019-11-01 NOTE — Telephone Encounter (Signed)
No forms noted from patient or [employer] at this time.   Called patient to inquire. Was advised that patient has not contacted [employer] but will start a claim and have information sent to [facility].

## 2019-11-10 ENCOUNTER — Telehealth: Payer: Self-pay | Admitting: Family Medicine

## 2019-11-10 NOTE — Telephone Encounter (Signed)
Awaiting forms
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
## 2019-11-10 NOTE — Telephone Encounter (Signed)
#  [phone] Pt drop off FMLA  Forms pt states employee has fax over two weeks need to filled out by 11/17/19. Forms in yellow folder

## 2019-11-11 NOTE — Telephone Encounter (Signed)
Noted  

## 2019-11-11 NOTE — Telephone Encounter (Signed)
Received completed  FMLA from provider.   No charge per provider.   Faxed forms.

## 2019-11-22 ENCOUNTER — Other Ambulatory Visit: Payer: Self-pay | Admitting: Family Medicine

## 2019-11-23 ENCOUNTER — Other Ambulatory Visit: Payer: Self-pay | Admitting: Family Medicine

## 2019-11-30 ENCOUNTER — Ambulatory Visit: Payer: No Typology Code available for payment source | Admitting: Family Medicine

## 2019-12-05 ENCOUNTER — Ambulatory Visit (INDEPENDENT_AMBULATORY_CARE_PROVIDER_SITE_OTHER): Payer: No Typology Code available for payment source | Admitting: Family Medicine

## 2019-12-05 ENCOUNTER — Encounter: Payer: Self-pay | Admitting: Family Medicine

## 2019-12-05 ENCOUNTER — Other Ambulatory Visit: Payer: Self-pay

## 2019-12-05 ENCOUNTER — Ambulatory Visit (INDEPENDENT_AMBULATORY_CARE_PROVIDER_SITE_OTHER): Payer: No Typology Code available for payment source

## 2019-12-05 DIAGNOSIS — S322XXD Fracture of coccyx, subsequent encounter for fracture with routine healing: Secondary | ICD-10-CM | POA: Diagnosis not present

## 2019-12-05 DIAGNOSIS — S301XXD Contusion of abdominal wall, subsequent encounter: Secondary | ICD-10-CM | POA: Diagnosis not present

## 2019-12-05 DIAGNOSIS — S3210XD Unspecified fracture of sacrum, subsequent encounter for fracture with routine healing: Secondary | ICD-10-CM
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
## 2019-12-06 ENCOUNTER — Encounter: Payer: Self-pay | Admitting: Family Medicine

## 2019-12-06 ENCOUNTER — Ambulatory Visit (INDEPENDENT_AMBULATORY_CARE_PROVIDER_SITE_OTHER): Payer: No Typology Code available for payment source | Admitting: Family Medicine

## 2019-12-06 VITALS — BP 122/72 | HR 90 | Temp 98.3°F | Resp 14 | Ht 66.0 in | Wt 290.0 lb

## 2019-12-06 DIAGNOSIS — S3210XD Unspecified fracture of sacrum, subsequent encounter for fracture with routine healing: Secondary | ICD-10-CM

## 2019-12-06 DIAGNOSIS — S322XXD Fracture of coccyx, subsequent encounter for fracture with routine healing: Secondary | ICD-10-CM

## 2019-12-06 DIAGNOSIS — M545 Low back pain, unspecified: Secondary | ICD-10-CM

## 2019-12-06 NOTE — Assessment & Plan Note (Signed)
ongoing pain in low back and sacrum, slow healing fracture. Pt to obtain xray of lumbar spine did not get before, still has low back pain, r/o fracture higher up  Continue prn nsaid , robaxin  Will discuss with her surgeon about return to work restrictions as his note recommends PT before return to work  Hematoma no longer causing pain and has likely resolved and current symptoms, but will obtain general surgery note

## 2019-12-06 NOTE — Patient Instructions (Addendum)
Get xray tomorrow F/U schedule physical
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
## 2019-12-07 ENCOUNTER — Other Ambulatory Visit: Payer: Self-pay

## 2019-12-07 ENCOUNTER — Ambulatory Visit (HOSPITAL_COMMUNITY): Payer: No Typology Code available for payment source | Attending: Family Medicine | Admitting: Physical Therapy

## 2019-12-07 ENCOUNTER — Encounter (HOSPITAL_COMMUNITY): Payer: Self-pay | Admitting: Physical Therapy

## 2019-12-07 ENCOUNTER — Encounter: Payer: Self-pay | Admitting: Family Medicine

## 2019-12-07 ENCOUNTER — Encounter: Payer: Self-pay | Admitting: Radiology

## 2019-12-07 DIAGNOSIS — M533 Sacrococcygeal disorders, not elsewhere classified: Secondary | ICD-10-CM | POA: Diagnosis not present

## 2019-12-07 DIAGNOSIS — M542 Cervicalgia: Secondary | ICD-10-CM | POA: Diagnosis present
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
## 2019-12-08 ENCOUNTER — Telehealth: Payer: Self-pay | Admitting: *Deleted

## 2019-12-08 ENCOUNTER — Ambulatory Visit (HOSPITAL_COMMUNITY): Payer: No Typology Code available for payment source | Admitting: Physical Therapy

## 2019-12-08 ENCOUNTER — Telehealth (HOSPITAL_COMMUNITY): Payer: Self-pay | Admitting: Physical Therapy

## 2019-12-08 DIAGNOSIS — M533 Sacrococcygeal disorders, not elsewhere classified: Secondary | ICD-10-CM

## 2019-12-08 DIAGNOSIS — M542 Cervicalgia: Secondary | ICD-10-CM

## 2019-12-08 NOTE — Telephone Encounter (Signed)
-----   Message from Alycia Rossetti, MD sent at 12/08/2019 10:53 AM EDT ----- Regarding: FW: Return to work Let pt  Dr. Junius Roads wrote a note for her for light duty. She needs to get the documentation from them.  Dr. Buelah Manis ----- Message ----- From: Eunice Blase, MD Sent: 12/07/2019  11:47 AM EDT To: Alycia Rossetti, MD Subject: RE: Return to work                             [facility], will do.  Ronalee Belts  ----- Message ----- From: Alycia Rossetti, MD Sent: 12/07/2019  10:21 AM EDT To: Eunice Blase, MD Subject: RE: Return to work                             Yes, I you could write a note that would be helpful. Thank you ----- Message ----- From: Eunice Blase, MD Sent: 12/07/2019   8:15 AM EDT To: Alycia Rossetti, MD Subject: RE: Return to work                             I'm totally fine with her returning to light duty if they will let her.  Since it wasn't a work-related injury, they might not.  But we can certainly try.  Shall I write a note for her?  Ronalee Belts   ----- Message ----- From: Alycia Rossetti, MD Sent: 12/06/2019   8:32 PM EDT To: Eunice Blase, MD Subject: Return to work                                   Hello,   Ms. Sabine came in today. She wants to return to work with restrictions. I reviewed your note, you recommended PT for 4-6 weeks before return to work.  Finances are the main issue of course.  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   Do you have any other recommendations or thoughts on her returning basically on light duty?   [employer]

## 2019-12-08 NOTE — Telephone Encounter (Signed)
PT CALLED TO CX TODAY'S APPT [facility] SHE IS RUNNING LATE
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
## 2019-12-08 NOTE — Telephone Encounter (Signed)
Call placed to patient and patient made aware.  

## 2019-12-09 ENCOUNTER — Encounter: Payer: Self-pay | Admitting: Radiology

## 2019-12-09 NOTE — Telephone Encounter (Signed)
Patient called and requested that the note be faxed to [phone].  Thank you.

## 2019-12-14 ENCOUNTER — Ambulatory Visit (HOSPITAL_COMMUNITY): Payer: No Typology Code available for payment source | Admitting: Physical Therapy

## 2019-12-14 ENCOUNTER — Telehealth (HOSPITAL_COMMUNITY): Payer: Self-pay | Admitting: Physical Therapy

## 2019-12-14 ENCOUNTER — Other Ambulatory Visit: Payer: Self-pay

## 2019-12-14 ENCOUNTER — Other Ambulatory Visit: Payer: Self-pay | Admitting: *Deleted

## 2019-12-14 DIAGNOSIS — M533 Sacrococcygeal disorders, not elsewhere classified: Secondary | ICD-10-CM

## 2019-12-14 DIAGNOSIS — M542 Cervicalgia: Secondary | ICD-10-CM

## 2019-12-14 MED ORDER — POTASSIUM CHLORIDE CRYS ER 20 MEQ PO TBCR: 20.0000 meq | EXTENDED_RELEASE_TABLET | Freq: Every day | ORAL | 3 refills | Status: DC

## 2019-12-14 NOTE — Telephone Encounter (Signed)
NO show 1:  Called pt.  Husband works third shift and just got home therefore she could not get to treatment.  PT will not be able to make Thursday appointment either but she is going to call the front desk to see if she can change to a time later in the day.  Rayetta Humphrey, [place] CLT [phone]
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
## 2019-12-15 ENCOUNTER — Ambulatory Visit (HOSPITAL_COMMUNITY): Payer: No Typology Code available for payment source

## 2019-12-16 ENCOUNTER — Ambulatory Visit (HOSPITAL_COMMUNITY): Payer: No Typology Code available for payment source | Admitting: Physical Therapy

## 2019-12-27 ENCOUNTER — Encounter: Payer: Self-pay | Admitting: Family Medicine

## 2020-01-04 ENCOUNTER — Ambulatory Visit (HOSPITAL_COMMUNITY): Payer: No Typology Code available for payment source | Attending: Family Medicine | Admitting: Physical Therapy

## 2020-01-04 ENCOUNTER — Other Ambulatory Visit: Payer: Self-pay

## 2020-01-04 ENCOUNTER — Encounter (HOSPITAL_COMMUNITY): Payer: Self-pay | Admitting: Physical Therapy

## 2020-01-04 DIAGNOSIS — M542 Cervicalgia: Secondary | ICD-10-CM | POA: Diagnosis not present

## 2020-01-04 DIAGNOSIS — M533 Sacrococcygeal disorders, not elsewhere classified: Secondary | ICD-10-CM | POA: Diagnosis present
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
## 2020-01-04 NOTE — Patient Instructions (Signed)
Access Code: [email or web address] URL: [email or web address] Date: 01/04/2020 Prepared by: Josue Hector  Exercises Hooklying Gluteal Sets - 3 x daily - 7 x weekly - 2 sets - 10 reps - 5 hold Hook Lying Single Knee to Chest Stretch with Towel - 3 x daily - 7 x weekly - 1 sets - 5 reps - 10 hold Seated Piriformis Stretch - 3 x daily - 7 x weekly - 1 sets - 3 reps - 30 hold

## 2020-01-11 ENCOUNTER — Encounter (HOSPITAL_COMMUNITY): Payer: No Typology Code available for payment source

## 2020-01-11 ENCOUNTER — Telehealth (HOSPITAL_COMMUNITY): Payer: Self-pay

## 2020-01-11 NOTE — Telephone Encounter (Signed)
No show, called and spoke to family member who reports she was at work.  Left message reminding next apt date and time.    Ihor Austin, LPTA/CLT; Delana Meyer [phone]

## 2020-01-13 ENCOUNTER — Telehealth (HOSPITAL_COMMUNITY): Payer: Self-pay

## 2020-01-13 ENCOUNTER — Encounter (HOSPITAL_COMMUNITY): Payer: No Typology Code available for payment source

## 2020-01-13 NOTE — Telephone Encounter (Signed)
No show, called and spoke to her mother who stated she was not home.  Attempted mobile number but unable to leave message.  Informed no show policy to her mother and that she will need to make apts one at time per policy.  Reminded next apt date and time with contact information given.  Ihor Austin, LPTA/CLT; Delana Meyer [phone]

## 2020-01-16 ENCOUNTER — Encounter (HOSPITAL_COMMUNITY): Payer: Self-pay | Admitting: Physical Therapy

## 2020-01-16 ENCOUNTER — Ambulatory Visit (HOSPITAL_COMMUNITY): Payer: No Typology Code available for payment source | Admitting: Physical Therapy

## 2020-01-16 ENCOUNTER — Other Ambulatory Visit: Payer: Self-pay

## 2020-01-16 DIAGNOSIS — M533 Sacrococcygeal disorders, not elsewhere classified: Secondary | ICD-10-CM

## 2020-01-16 DIAGNOSIS — M542 Cervicalgia: Secondary | ICD-10-CM
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
## 2020-01-18 ENCOUNTER — Encounter (HOSPITAL_COMMUNITY): Payer: No Typology Code available for payment source | Admitting: Physical Therapy

## 2020-01-24 ENCOUNTER — Encounter (HOSPITAL_COMMUNITY): Payer: No Typology Code available for payment source

## 2020-01-26 ENCOUNTER — Encounter (HOSPITAL_COMMUNITY): Payer: No Typology Code available for payment source | Admitting: Physical Therapy

## 2020-01-31 ENCOUNTER — Other Ambulatory Visit: Payer: Self-pay

## 2020-01-31 ENCOUNTER — Ambulatory Visit (HOSPITAL_COMMUNITY): Payer: No Typology Code available for payment source | Attending: Family Medicine | Admitting: Physical Therapy

## 2020-01-31 DIAGNOSIS — M542 Cervicalgia: Secondary | ICD-10-CM | POA: Insufficient documentation

## 2020-01-31 DIAGNOSIS — M533 Sacrococcygeal disorders, not elsewhere classified: Secondary | ICD-10-CM | POA: Insufficient documentation
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
## 2020-02-10 ENCOUNTER — Ambulatory Visit: Payer: No Typology Code available for payment source | Admitting: Family Medicine

## 2020-02-16 ENCOUNTER — Ambulatory Visit: Payer: No Typology Code available for payment source | Admitting: Family Medicine

## 2020-02-24 ENCOUNTER — Ambulatory Visit: Payer: No Typology Code available for payment source | Admitting: Family Medicine

## 2020-02-29 ENCOUNTER — Telehealth: Payer: Self-pay | Admitting: *Deleted

## 2020-02-29 NOTE — Telephone Encounter (Signed)
Received VM from patient.   Reports that she would like to discuss COVID vaccine exemption waiver.   Call placed to inquire. Campus.

## 2020-03-01 NOTE — Telephone Encounter (Signed)
Call placed to patient. LMTRC.  

## 2020-03-02 NOTE — Telephone Encounter (Signed)
This is not a reason for COVID-19 Vaccine exemption. She can site religous or allergy to vaccine.  I recommend she get her vaccine
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
## 2020-03-02 NOTE — Telephone Encounter (Signed)
Call placed to patient to advise of provider recommendations. Line noted busy.

## 2020-03-06 NOTE — Telephone Encounter (Signed)
Multiple calls placed to patient with no answer and no return call.   Message to be closed.   Patient aware per MyChart.

## 2020-03-27 ENCOUNTER — Encounter: Payer: Self-pay | Admitting: Family Medicine

## 2020-03-27 ENCOUNTER — Telehealth: Payer: Self-pay

## 2020-03-27 NOTE — Telephone Encounter (Signed)
Patient called to inform the doctor her hcg test was positive. She wanted a sooner appt than 04/02/20

## 2020-03-27 NOTE — Telephone Encounter (Signed)
Message sent to pt via mychart Will keep oct 4th appt for now, unless she gets appt with OB/GYN Nothing urgent at this time

## 2020-04-02 ENCOUNTER — Encounter: Payer: Self-pay | Admitting: Family Medicine

## 2020-04-02 ENCOUNTER — Ambulatory Visit (INDEPENDENT_AMBULATORY_CARE_PROVIDER_SITE_OTHER): Payer: No Typology Code available for payment source | Admitting: Family Medicine

## 2020-04-02 ENCOUNTER — Other Ambulatory Visit: Payer: Self-pay

## 2020-04-02 VITALS — BP 142/88 | HR 80 | Temp 98.1°F | Resp 14 | Ht 66.0 in | Wt 292.0 lb

## 2020-04-02 DIAGNOSIS — Z3200 Encounter for pregnancy test, result unknown: Secondary | ICD-10-CM

## 2020-04-02 LAB — PREGNANCY, URINE: Preg Test, Ur: NEGATIVE

## 2020-04-02 NOTE — Progress Notes (Signed)
   Subjective:    Patient ID: Elizabeth Harper, female    DOB: 1970/11/04, 49 y.o.   MRN: [id number]  Patient presents for Home Preg Test + Patient here secondary to positive pregnancy test at home.  She typically has a regular menstrual cycle.  Was due September 2.  She is having lower abdominal cramping but no bleeding as well as breast tenderness and nausea.  She had a positive pregnancy test on September 27.  She is here today for confirmation.  She is taking a prenatal vitamin.       Review Of Systems:  GEN- denies fatigue, fever, weight loss,weakness, recent illness HEENT- denies eye drainage, change in vision, nasal discharge, CVS- denies chest pain, palpitations RESP- denies SOB, cough, wheeze ABD- denies N/V, change in stools, abd pain GU- denies dysuria, hematuria, dribbling, incontinence MSK- denies joint pain, muscle aches, injury Neuro- denies headache, dizziness, syncope, seizure activity       Objective:    BP (!) 142/88   Pulse 80   Temp 98.1 F (36.7 C) (Temporal)   Resp 14   Ht 5\' 6"  (1.676 m)   Wt 292 lb (132.5 kg)   SpO2 100%   BMI 47.13 kg/m  GEN- NAD, alert and oriented x3, obese  CVS- RRR, no murmur RESP-CTAB ABD-NABS,soft,NT,ND EXT- No edema Pulses- Radial, 2+        Assessment & Plan:      Problem List Items Addressed This Visit    None    Visit Diagnoses    Encounter for pregnancy test, result unknown    -  Primary   urine pregnancy in the office is negative, obtain HCG quant, if positive referral to GYN, if neg she is the typical age of perimenopausal so we would just wait to see if cycle occurs Benign abd exam Continue vitamins and current meds If pregnancy positive D/C HCTZ, start labetalol 100mg  BID for HTN   Relevant Orders   Pregnancy, urine   hCG, quantitative, pregnancy      Note: This dictation was prepared with Dragon dictation along with smaller phrase technology. Any transcriptional errors that result from this  process are unintentional.

## 2020-04-02 NOTE — Patient Instructions (Signed)
We will call with results  Keep hydrated Continue pre natal vitamins  F/u pending results

## 2020-04-03 ENCOUNTER — Other Ambulatory Visit: Payer: Self-pay | Admitting: *Deleted

## 2020-04-03 DIAGNOSIS — N926 Irregular menstruation, unspecified: Secondary | ICD-10-CM

## 2020-04-03 DIAGNOSIS — N951 Menopausal and female climacteric states: Secondary | ICD-10-CM

## 2020-04-03 LAB — HCG, QUANTITATIVE, PREGNANCY: HCG, Total, QN: 3 m[IU]/mL

## 2020-04-06 ENCOUNTER — Telehealth: Payer: Self-pay | Admitting: Family Medicine

## 2020-04-06 NOTE — Telephone Encounter (Signed)
Call placed to patient. LMTRC.  

## 2020-04-06 NOTE — Telephone Encounter (Signed)
  By our labs she is NOT pregnant She may get the COVID shot,  Covid shot is also safe in pregnant patients

## 2020-04-06 NOTE — Telephone Encounter (Signed)
Patient called to see if it is safe for her to get the 2nd Covid shot. She states that since being pregnant might still be the case.  CB# [phone]

## 2020-04-09 NOTE — Telephone Encounter (Signed)
Call placed to patient and patient made aware.   Reports that she did have 2cd vaccination on 04/08/2020.  Also states that she awoke today to spotting. Advised to contact [facility] to expedite appointment.

## 2020-05-29 ENCOUNTER — Other Ambulatory Visit: Payer: Self-pay | Admitting: Family Medicine

## 2020-08-25 ENCOUNTER — Other Ambulatory Visit: Payer: Self-pay | Admitting: Family Medicine

## 2020-08-28 ENCOUNTER — Other Ambulatory Visit: Payer: Self-pay | Admitting: *Deleted
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
## 2020-09-28 ENCOUNTER — Other Ambulatory Visit: Payer: Self-pay

## 2020-09-28 ENCOUNTER — Emergency Department (HOSPITAL_COMMUNITY)
Admission: EM | Admit: 2020-09-28 | Discharge: 2020-09-28 | Disposition: A | Discharge status: Home / Self Care | Payer: No Typology Code available for payment source | Attending: Emergency Medicine | Admitting: Emergency Medicine

## 2020-09-28 ENCOUNTER — Encounter (HOSPITAL_COMMUNITY): Payer: Self-pay | Admitting: *Deleted

## 2020-09-28 DIAGNOSIS — R519 Headache, unspecified: Secondary | ICD-10-CM | POA: Insufficient documentation

## 2020-09-28 DIAGNOSIS — I10 Essential (primary) hypertension: Secondary | ICD-10-CM | POA: Insufficient documentation

## 2020-09-28 DIAGNOSIS — R42 Dizziness and giddiness: Secondary | ICD-10-CM | POA: Insufficient documentation

## 2020-09-28 DIAGNOSIS — E876 Hypokalemia: Secondary | ICD-10-CM | POA: Insufficient documentation

## 2020-09-28 LAB — BASIC METABOLIC PANEL
Anion gap: 8 (ref 5–15)
BUN: 11 mg/dL (ref 6–20)
CO2: 26 mmol/L (ref 22–32)
Calcium: 9 mg/dL (ref 8.9–10.3)
Chloride: 101 mmol/L (ref 98–111)
Creatinine, Ser: 0.71 mg/dL (ref 0.44–1.00)
GFR, Estimated: >60 mL/min (ref >60)
Glucose, Bld: 105 mg/dL — ABNORMAL HIGH (ref 70–99)
Potassium: 2.9 mmol/L — ABNORMAL LOW (ref 3.5–5.1)
Sodium: 135 mmol/L (ref 135–145)

## 2020-09-28 LAB — CBC WITH DIFFERENTIAL/PLATELET
Abs Immature Granulocytes: 0.05 10*3/uL (ref 0.00–0.07)
Basophils Absolute: 0 10*3/uL (ref 0.0–0.1)
Basophils Relative: 0 %
Eosinophils Absolute: 0.1 10*3/uL (ref 0.0–0.5)
Eosinophils Relative: 1 %
HCT: 37.8 % (ref 36.0–46.0)
Hemoglobin: 11.6 g/dL — ABNORMAL LOW (ref 12.0–15.0)
Immature Granulocytes: 1 %
Lymphocytes Relative: 20 %
Lymphs Abs: 2 10*3/uL (ref 0.7–4.0)
MCH: 22.7 pg — ABNORMAL LOW (ref 26.0–34.0)
MCHC: 30.7 g/dL (ref 30.0–36.0)
MCV: 73.8 fL — ABNORMAL LOW (ref 80.0–100.0)
Monocytes Absolute: 0.5 10*3/uL (ref 0.1–1.0)
Monocytes Relative: 5 %
Neutro Abs: 7.4 10*3/uL (ref 1.7–7.7)
Neutrophils Relative %: 73 %
Platelets: 355 10*3/uL (ref 150–400)
RBC: 5.12 MIL/uL — ABNORMAL HIGH (ref 3.87–5.11)
RDW: 15.8 % — ABNORMAL HIGH (ref 11.5–15.5)
WBC: 10 10*3/uL (ref 4.0–10.5)
nRBC: 0 % (ref 0.0–0.2)

## 2020-09-28 MED ORDER — DIPHENHYDRAMINE HCL 50 MG/ML IJ SOLN
25.0000 mg | Freq: Once | INTRAMUSCULAR | Status: AC
  Administered 2020-09-28: 25 mg via INTRAVENOUS
  Filled 2020-09-28: qty 1

## 2020-09-28 MED ORDER — SODIUM CHLORIDE 0.9 % IV BOLUS
500.0000 mL | Freq: Once | INTRAVENOUS | Status: AC
  Administered 2020-09-28: 500 mL via INTRAVENOUS

## 2020-09-28 MED ORDER — POTASSIUM CHLORIDE CRYS ER 20 MEQ PO TBCR
40.0000 meq | EXTENDED_RELEASE_TABLET | Freq: Once | ORAL | Status: AC
  Administered 2020-09-28: 40 meq via ORAL
  Filled 2020-09-28: qty 2

## 2020-09-28 MED ORDER — METOCLOPRAMIDE HCL 5 MG/ML IJ SOLN
10.0000 mg | Freq: Once | INTRAMUSCULAR | Status: AC
  Administered 2020-09-28: 10 mg via INTRAVENOUS
  Filled 2020-09-28: qty 2

## 2020-09-28 NOTE — ED Notes (Signed)
Updated pt on discharge plan, that once the provider places her discharge orders the paperwork will be provided and reviewed. She expressed understanding.

## 2020-09-28 NOTE — ED Triage Notes (Signed)
C/o headache and pressure behind eyes for the last hours

## 2020-09-28 NOTE — ED Notes (Signed)
Dc instructions reviewed with pt including OTC medication she can take as needed for headaches. Explained she needs to resume her home HTN medication. She is aware to make f/u appointment with her provider.
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
## 2020-09-28 NOTE — Discharge Instructions (Addendum)
Suspect you are suffering from a headache.  Recommend over-the-counter pain medications like ibuprofen and/or Tylenol every 6 hours  as needed.  Please stay hydrated and eat small snacks as this can also can prevent headaches.  Want you to follow-up with your PCP for further evaluation aslo Please follow-up about your low potassium  Come back to the emergency department if you develop chest pain, shortness of breath, severe abdominal pain, uncontrolled nausea, vomiting, diarrhea.

## 2020-09-28 NOTE — ED Notes (Signed)
Pt states she is feeling better, pressure has decreased. Pt updated on POC and that the provider will see her again for discharge instructions.

## 2020-09-28 NOTE — ED Notes (Signed)
Pt A&Ox4, RR even and nonlabored. States she was at work today when she started having pressure behind her eyes. Coworkers checked her BP and noted it was elevated. Pt states she takes HCTZ, has missed her doses this past week. States she believes stress along with the missing doses have affected her BP. At this time she denies CP, endorses ongoing pressure behind her eyes. Denies blurred vision or vision changes.

## 2020-10-01 ENCOUNTER — Telehealth: Payer: Self-pay | Admitting: *Deleted

## 2020-10-01 NOTE — Telephone Encounter (Signed)
Transition Care Management Unsuccessful Follow-up Telephone Call  Date of discharge and from where:  09/28/2020 [facility]  Attempts:  1st Attempt  Reason for unsuccessful TCM follow-up call:  Left voice message

## 2020-10-02 NOTE — Telephone Encounter (Signed)
Transition Care Management Unsuccessful Follow-up Telephone Call  Date of discharge and from where:  09/28/2020 - [facility]  Attempts:  2nd Attempt  Reason for unsuccessful TCM follow-up call:  Left voice message

## 2020-10-03 NOTE — Telephone Encounter (Signed)
Transition Care Management Follow-up Telephone Call  Date of discharge and from where: 09/28/2020 - [facility]  How have you been since you were released from the hospital? "Much better"  Any questions or concerns? No  Items Reviewed:  Did the pt receive and understand the discharge instructions provided? Yes   Medications obtained and verified? Yes   Other? No   Any new allergies since your discharge? No   Dietary orders reviewed? No  Do you have support at home? Yes    Functional Questionnaire: (I = Independent and D = Dependent) ADLs: I  Bathing/Dressing- I  Meal Prep- I  Eating- I  Maintaining continence- I  Transferring/Ambulation- I  Managing Meds- I  Follow up appointments reviewed:   PCP Hospital f/u appt confirmed? No    Specialist Hospital f/u appt confirmed? No   Are transportation arrangements needed? No   If their condition worsens, is the pt aware to call PCP or go to the Emergency Dept.? Yes  Was the patient provided with contact information for the PCP's office or ED? Yes  Was to pt encouraged to call back with questions or concerns? Yes

## 2020-10-26 ENCOUNTER — Other Ambulatory Visit: Payer: Self-pay | Admitting: Family Medicine

## 2021-02-14 ENCOUNTER — Telehealth: Payer: Self-pay | Admitting: *Deleted

## 2021-02-14 NOTE — Telephone Encounter (Signed)
Call placed to patient and patient made aware.   Appointment scheduled.  

## 2021-02-14 NOTE — Telephone Encounter (Signed)
Received call from patient.   Reports that she was bitten by an unknown insect and area surrounding bite is warm to the touch and red.   Call placed to patient for more information. No answer. No VM.

## 2021-02-14 NOTE — Telephone Encounter (Signed)
Call placed to patient.   Reports that she has multiple areas of possible mosquito bites that she has been scratching.   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   Requesting orders for ABTx and Bactroban.   Please advise.

## 2021-02-15 ENCOUNTER — Telehealth: Payer: Self-pay

## 2021-02-15 ENCOUNTER — Ambulatory Visit: Payer: No Typology Code available for payment source | Admitting: Nurse Practitioner

## 2021-02-15 NOTE — Telephone Encounter (Signed)
Patient can use OTC Cortisone or Benadryl Cream.   Call placed to patient. No answer. No VM.

## 2021-02-15 NOTE — Telephone Encounter (Signed)
Pt called in today to reschedule appt for rash that she has.Pt wanted to know if she could have a med prescribed to put on rash until appt on 8/24. Please advise.  Cb#: [phone]

## 2021-02-20 ENCOUNTER — Ambulatory Visit: Payer: Self-pay | Admitting: Nurse Practitioner

## 2021-03-15 ENCOUNTER — Telehealth: Payer: Self-pay

## 2021-03-15 MED ORDER — HYDROCHLOROTHIAZIDE 25 MG PO TABS: 25.0000 mg | ORAL_TABLET | Freq: Every day | ORAL | 0 refills | Status: DC

## 2021-03-15 MED ORDER — POTASSIUM CHLORIDE CRYS ER 20 MEQ PO TBCR: 20.0000 meq | EXTENDED_RELEASE_TABLET | Freq: Every day | ORAL | 0 refills | Status: DC

## 2021-03-15 NOTE — Telephone Encounter (Signed)
Pt called in checking on the status of her refills of potassium chloride SA (KLOR-CON) 20 MEQ tablet, and hydrochlorothiazide (HYDRODIURIL) 25 MG tablet being sent to pharmacy. Please call asap. Pt is at pharmacy now trying to pick up meds.   Cb#: [phone]

## 2021-03-15 NOTE — Telephone Encounter (Signed)
Patient has not been seen since 03/2020.  Patient has not established care with NP.   Partial fill given to pharmacy and advised to schedule OV to establish care.   No further refills will be given without an appointment.

## 2021-04-09 ENCOUNTER — Other Ambulatory Visit: Payer: Self-pay | Admitting: *Deleted

## 2021-04-09 MED ORDER — POTASSIUM CHLORIDE CRYS ER 20 MEQ PO TBCR: 20.0000 meq | EXTENDED_RELEASE_TABLET | Freq: Every day | ORAL | 0 refills | Status: DC

## 2021-04-09 MED ORDER — FAMOTIDINE 20 MG PO TABS: 20.0000 mg | ORAL_TABLET | Freq: Two times a day (BID) | ORAL | 3 refills | Status: AC

## 2021-04-09 MED ORDER — HYDROCHLOROTHIAZIDE 25 MG PO TABS: 25.0000 mg | ORAL_TABLET | Freq: Every day | ORAL | 0 refills | Status: DC

## 2021-04-11 ENCOUNTER — Encounter: Payer: Self-pay | Admitting: Nurse Practitioner

## 2021-04-11 ENCOUNTER — Ambulatory Visit (INDEPENDENT_AMBULATORY_CARE_PROVIDER_SITE_OTHER): Payer: No Typology Code available for payment source | Admitting: Nurse Practitioner

## 2021-04-11 ENCOUNTER — Other Ambulatory Visit: Payer: Self-pay

## 2021-04-11 ENCOUNTER — Telehealth: Payer: Self-pay | Admitting: Nurse Practitioner

## 2021-04-11 VITALS — BP 130/68 | HR 55 | Temp 97.7°F | Ht 66.0 in | Wt 288.6 lb

## 2021-04-11 DIAGNOSIS — R21 Rash and other nonspecific skin eruption: Secondary | ICD-10-CM

## 2021-04-11 MED ORDER — TRIAMCINOLONE ACETONIDE 0.5 % EX OINT: 1.0000 "application " | TOPICAL_OINTMENT | Freq: Two times a day (BID) | CUTANEOUS | 0 refills | Status: DC
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
## 2021-04-11 NOTE — Telephone Encounter (Signed)
Patient called to accept provider's offer to send in Rx for prednisone to treat rash; patient just wants relief from sx.  Patient had flu shot last week and is ok with using prednisone now. Requesting Rx asap.  Pharmacy confirmed as   [place], [place] - [address]  [address], [place] [address]  Phone:  [phone]  Fax:  [phone]   Please advise at [phone].

## 2021-04-12 MED ORDER — PREDNISONE 10 MG PO TABS
ORAL_TABLET | ORAL | 0 refills | Status: AC
Start: 2021-04-12 — End: 2021-04-18

## 2021-04-23 ENCOUNTER — Telehealth: Payer: Self-pay | Admitting: *Deleted

## 2021-04-23 DIAGNOSIS — R21 Rash and other nonspecific skin eruption: Secondary | ICD-10-CM

## 2021-04-23 MED ORDER — VALACYCLOVIR HCL 1 G PO TABS: 1000.0000 mg | ORAL_TABLET | Freq: Two times a day (BID) | ORAL | 0 refills | Status: DC

## 2021-04-23 MED ORDER — VALACYCLOVIR HCL 1 G PO TABS: 1000.0000 mg | ORAL_TABLET | Freq: Two times a day (BID) | ORAL | 0 refills | Status: AC

## 2021-04-23 NOTE — Telephone Encounter (Signed)
Received call from patient.   Reports that she continues to have severe itching. States that triamcinolone is not helping her itching at this time. Reports that she has also tried OTC cortisone 10 and benadryl cream with no relief.   States that she has completed Prednisone taper and is currently taking Pepcid. Advised to add OTC Zyrtec or Claritin for antihistamine.   Inquired if there is another cream or oral medication she can take for itching.   Please advise.   Also requested refill on Valtrex. Prescription sent to pharmacy.

## 2021-04-24 MED ORDER — CLOBETASOL PROPIONATE 0.05 % EX OINT: 1.0000 "application " | TOPICAL_OINTMENT | Freq: Two times a day (BID) | CUTANEOUS | 0 refills | Status: DC

## 2021-04-24 NOTE — Telephone Encounter (Signed)
Call placed to patient and patient made aware.  

## 2021-04-24 NOTE — Telephone Encounter (Signed)
I sent in orders for Temovate.  If this does not help, we may want to get her in with Dermatology.

## 2021-04-24 NOTE — Addendum Note (Signed)
Addended by: Noemi Chapel A on: 04/24/2021 08:03 AM   Modules accepted: Orders

## 2021-04-25 ENCOUNTER — Encounter: Payer: No Typology Code available for payment source | Admitting: Nurse Practitioner

## 2021-04-26 ENCOUNTER — Other Ambulatory Visit: Payer: Self-pay

## 2021-04-26 ENCOUNTER — Ambulatory Visit (INDEPENDENT_AMBULATORY_CARE_PROVIDER_SITE_OTHER): Payer: Self-pay | Admitting: Nurse Practitioner

## 2021-04-26 ENCOUNTER — Encounter: Payer: Self-pay | Admitting: Nurse Practitioner

## 2021-04-26 VITALS — BP 138/82 | HR 97 | Temp 98.5°F | Resp 18 | Ht 66.0 in | Wt 290.0 lb

## 2021-04-26 DIAGNOSIS — Z124 Encounter for screening for malignant neoplasm of cervix: Secondary | ICD-10-CM

## 2021-04-26 DIAGNOSIS — Z131 Encounter for screening for diabetes mellitus: Secondary | ICD-10-CM

## 2021-04-26 DIAGNOSIS — Z13220 Encounter for screening for lipoid disorders: Secondary | ICD-10-CM

## 2021-04-26 DIAGNOSIS — Z0000 Encounter for general adult medical examination without abnormal findings: Secondary | ICD-10-CM

## 2021-04-26 DIAGNOSIS — Z1211 Encounter for screening for malignant neoplasm of colon: Secondary | ICD-10-CM

## 2021-04-26 DIAGNOSIS — Z136 Encounter for screening for cardiovascular disorders: Secondary | ICD-10-CM

## 2021-04-26 DIAGNOSIS — D508 Other iron deficiency anemias: Secondary | ICD-10-CM

## 2021-04-26 DIAGNOSIS — Z1231 Encounter for screening mammogram for malignant neoplasm of breast: Secondary | ICD-10-CM

## 2021-04-26 DIAGNOSIS — Z13228 Encounter for screening for other metabolic disorders: Secondary | ICD-10-CM

## 2021-04-26 DIAGNOSIS — E876 Hypokalemia: Secondary | ICD-10-CM

## 2021-04-26 DIAGNOSIS — R21 Rash and other nonspecific skin eruption: Secondary | ICD-10-CM

## 2021-04-26 NOTE — Patient Instructions (Signed)
Check out these medications for weight loss: [place] and [id number].  Reach out to [place] if you'd like to discuss starting on one of them.

## 2021-04-26 NOTE — Assessment & Plan Note (Signed)
Chronic.  Unknown etiology.  She denies heavy menses.  Will check CBC today with anemia panel including ferritin, iron panel.  Consider referral to Hematology if remains low as she reports compliance with ferrous sulfate.

## 2021-04-26 NOTE — Progress Notes (Signed)
BP 138/82 (BP Location: Left Arm, Patient Position: Sitting)   Pulse 97   Temp 98.5 F (36.9 C) (Oral)   Resp 18   Ht 5\' 6"  (1.676 m)   Wt 290 lb (131.5 kg)   LMP 04/02/2021 (Approximate)   SpO2 97%   BMI 46.81 kg/m    Subjective:    Patient ID: Elizabeth Harper, female    DOB: 1971/04/07, 50 y.o.   MRN: [id number]  HPI: Elizabeth Harper is a 50 y.o. female presenting on 04/26/2021 for comprehensive medical examination. Current medical complaints include: none  ANEMIA Currently taking ferrous sulfate 325 daily in the morning with breakfast.  Denies GI upset. Denies heavy menses. Anemia status: stable Etiology of anemia: unknown Duration of anemia treatment:  years Compliance with treatment: good compliance Iron supplementation side effects: no Severity of anemia: moderate Fatigue: yes Decreased exercise tolerance: no  Dyspnea on exertion: no Palpitations: no Bleeding: no Pica: no  She reports the rash is getting better.  She has some new areas on her back.  At the end of the visit, she asks me to come back in to discuss weight loss medication.  We briefly discussed Wegovy, Saxenda.   She currently lives with: husband and children LMP: yes; 04/02/2021; 5 days, does not have to double up  Depression Screen done today and results listed below:  Depression screen [facility] 2/9 04/26/2021 01/28/2019 06/06/2016 02/14/2015 09/16/2013  Decreased Interest 0 0 0 0 0  Down, Depressed, Hopeless 0 0 1 0 0  PHQ - 2 Score 0 0 1 0 0  Altered sleeping 0 - 0 - -  Tired, decreased energy 1 - 3 - -  Change in appetite 0 - 0 - -  Feeling bad or failure about yourself  0 - 0 - -  Trouble concentrating 0 - 1 - -  Moving slowly or fidgety/restless 0 - 0 - -  Suicidal thoughts 0 - 0 - -  PHQ-9 Score 1 - 5 - -  Difficult doing work/chores Not difficult at all - - - -    The patient does not have a history of falls. I did not complete a risk assessment for falls. A plan of care for falls was not  documented.   Past Medical History:  Past Medical History:  Diagnosis Date   Fibroid uterus    Hypertension    Routine general medical examination at a health care facility 09/18/2013    Surgical History:  Past Surgical History:  Procedure Laterality Date   UTERINE ARTERY EMBOLIZATION  12/27/12    Medications:  Current Outpatient Medications on File Prior to Visit  Medication Sig   clobetasol ointment (TEMOVATE) 8.33 % Apply 1 application topically 2 (two) times daily.   famotidine (PEPCID) 20 MG tablet Take 1 tablet (20 mg total) by mouth 2 (two) times daily.   FEROSUL 325 (65 Fe) MG tablet TAKE (1) TABLET BY MOUTH TWICE A DAY WITH MEALS (BREAKFAST AND SUPPER)   hydrochlorothiazide (HYDRODIURIL) 25 MG tablet Take 1 tablet (25 mg total) by mouth daily.   potassium chloride SA (KLOR-CON) 20 MEQ tablet Take 1 tablet (20 mEq total) by mouth daily.   valACYclovir (VALTREX) 1000 MG tablet Take 1 tablet (1,000 mg total) by mouth 2 (two) times daily.   cholecalciferol (VITAMIN D) 1000 units tablet TAKE 1 TABLET BY MOUTH ONCE DAILY. (Patient not taking: No sig reported)   No current facility-administered medications on file prior to visit.  Allergies:  Allergies  Allergen Reactions   Iodinated Diagnostic Agents Other (See Comments)   Gadolinium Derivatives Nausea Only    Nausea during injection   Latex Rash    Social History:  Social History   Socioeconomic History   Marital status: Legally Separated    Spouse name: Not on file   Number of children: Not on file   Years of education: Not on file   Highest education level: Not on file  Occupational History   Not on file  Tobacco Use   Smoking status: Never   Smokeless tobacco: Never  Substance and Sexual Activity   Alcohol use: No   Drug use: No   Sexual activity: Never  Other Topics Concern   Not on file  Social History Narrative   Not on file   Social Determinants of Health   Financial Resource Strain: Not on  file  Food Insecurity: Not on file  Transportation Needs: Not on file  Physical Activity: Not on file  Stress: Not on file  Social Connections: Not on file  Intimate Partner Violence: Not on file   Social History   Tobacco Use  Smoking Status Never  Smokeless Tobacco Never   Social History   Substance and Sexual Activity  Alcohol Use No    Family History:  Family History  Problem Relation Age of Onset   Hyperlipidemia Mother    Hyperlipidemia Father    Diabetes Father    Hyperlipidemia Brother    Cancer Maternal Aunt        Breast cancer   Cancer Paternal Aunt        Breast Cancer    Past medical history, surgical history, medications, allergies, family history and social history reviewed with patient today and changes made to appropriate areas of the chart.   Review of Systems  Constitutional: Negative.   HENT: Negative.    Eyes: Negative.   Respiratory: Negative.    Cardiovascular: Negative.   Gastrointestinal: Negative.   Genitourinary: Negative.   Musculoskeletal: Negative.   Skin: Negative.   Neurological: Negative.   Psychiatric/Behavioral: Negative.        Objective:    BP 138/82 (BP Location: Left Arm, Patient Position: Sitting)   Pulse 97   Temp 98.5 F (36.9 C) (Oral)   Resp 18   Ht 5\' 6"  (1.676 m)   Wt 290 lb (131.5 kg)   LMP 04/02/2021 (Approximate)   SpO2 97%   BMI 46.81 kg/m   Wt Readings from Last 3 Encounters:  04/26/21 290 lb (131.5 kg)  04/11/21 288 lb 9.6 oz (130.9 kg)  09/28/20 271 lb (122.9 kg)    Physical Exam Vitals and nursing note reviewed. Exam conducted with a chaperone present [facility]).  Constitutional:      General: She is not in acute distress.    Appearance: Normal appearance. She is obese. She is not ill-appearing or toxic-appearing.  HENT:     Head: Normocephalic and atraumatic.     Right Ear: Tympanic membrane, ear canal and external ear normal.     Left Ear: Tympanic membrane, ear canal and external ear normal.      Nose: Nose normal. No congestion or rhinorrhea.     Mouth/Throat:     Mouth: Mucous membranes are moist.     Pharynx: Oropharynx is clear. No oropharyngeal exudate.  Eyes:     General: No scleral icterus.    Extraocular Movements: Extraocular movements intact.     Pupils: Pupils are equal, round,  and reactive to light.  Cardiovascular:     Rate and Rhythm: Normal rate and regular rhythm.     Pulses: Normal pulses.     Heart sounds: Normal heart sounds. No murmur heard. Pulmonary:     Effort: Pulmonary effort is normal. No respiratory distress.     Breath sounds: No wheezing or rhonchi.  Chest:  Breasts:    Right: Normal. No inverted nipple, mass, nipple discharge or skin change.     Left: Normal. No inverted nipple, mass, nipple discharge or skin change.  Abdominal:     General: Abdomen is flat. Bowel sounds are normal. There is no distension.     Palpations: Abdomen is soft.     Tenderness: There is no abdominal tenderness.  Musculoskeletal:        General: No swelling or tenderness. Normal range of motion.     Cervical back: Normal range of motion and neck supple. No rigidity or tenderness.     Right lower leg: No edema.     Left lower leg: No edema.  Skin:    General: Skin is warm and dry.     Capillary Refill: Capillary refill takes less than 2 seconds.     Coloration: Skin is not jaundiced or pale.     Findings: Rash present.  Neurological:     Mental Status: She is alert and oriented to person, place, and time.     Motor: No weakness.     Gait: Gait normal.  Psychiatric:        Mood and Affect: Mood normal.        Behavior: Behavior normal.        Thought Content: Thought content normal.        Judgment: Judgment normal.      Assessment & Plan:   Problem List Items Addressed This Visit       Other   Morbid obesity ([place])    Chronic.  Previously was prescribed phentermine.  Briefly discussed Starleen Blue today.  Patient would qualify based on BMI and  HTN.  She will do some research on these medications and reach out for an appointment to discuss this further if she would like to go down this path.      Anemia    Chronic.  Unknown etiology.  She denies heavy menses.  Will check CBC today with anemia panel including ferritin, iron panel.  Consider referral to Hematology if remains low as she reports compliance with ferrous sulfate.      Relevant Orders   CBC with Differential/Platelet   Iron, TIBC and Ferritin Panel   Other Visit Diagnoses     Annual physical exam    -  Primary   Hypokalemia       Recheck potassium level today on CMET.   Relevant Orders   COMPLETE METABOLIC PANEL WITH GFR   Screening for colon cancer       Relevant Orders   Ambulatory referral to Gastroenterology   Encounter for screening mammogram for breast cancer       Relevant Orders   MM DIGITAL SCREENING BILATERAL   Screening for cervical cancer       Relevant Orders   PAP,TP IMGw/HPV RNA,rflx [id number],85/27   Screening for diabetes mellitus       Relevant Orders   Hemoglobin A1c   Screening for metabolic disorder       Relevant Orders   COMPLETE METABOLIC PANEL WITH GFR   Encounter for lipid screening for  cardiovascular disease       She is not fasting today   Relevant Orders   Lipid panel   Rash and nonspecific skin eruption       Improving, encouraged use of Temovate which she has not picked up from the pharmacy yet.  Follow up with no improvement.        Follow up plan: No follow-ups on file.   LABORATORY TESTING:  - Pap smear: pap done  IMMUNIZATIONS:   - Tdap: Tetanus vaccination status reviewed: last tetanus booster within 10 years. - Influenza: Up to date - Pneumovax: Not applicable - Prevnar: Not applicable - HPV: Not applicable - Zostavax vaccine: Refused - COVID-19 vaccine: Has had 2 doses, declines further vaccines although I did recommend  SCREENING: -Mammogram: Ordered today  - Colonoscopy: Ordered today  - Bone  Density: Not applicable  -Hearing Test: Not applicable  -Spirometry: Not applicable   PATIENT COUNSELING:   Advised to take 1 mg of folate supplement per day if capable of pregnancy.   Sexuality: Discussed sexually transmitted diseases, partner selection, use of condoms, avoidance of unintended pregnancy  and contraceptive alternatives.   Advised to avoid cigarette smoking.  I discussed with the patient that most people either abstain from alcohol or drink within safe limits (<=14/week and <=4 drinks/occasion for males, <=7/weeks and <= 3 drinks/occasion for females) and that the risk for alcohol disorders and other health effects rises proportionally with the number of drinks per week and how often a drinker exceeds daily limits.  Discussed cessation/primary prevention of drug use and availability of treatment for abuse.   Diet: Encouraged to adjust caloric intake to maintain  or achieve ideal body weight, to reduce intake of dietary saturated fat and total fat, to limit sodium intake by avoiding high sodium foods and not adding table salt, and to maintain adequate dietary potassium and calcium preferably from fresh fruits, vegetables, and low-fat dairy products.    stressed the importance of regular exercise  Injury prevention: Discussed safety belts, safety helmets, smoke detector, smoking near bedding or upholstery.   Dental health: Discussed importance of regular tooth brushing, flossing, and dental visits.    NEXT PREVENTATIVE PHYSICAL DUE IN 1 YEAR. No follow-ups on file.

## 2021-04-26 NOTE — Assessment & Plan Note (Signed)
Chronic.  Previously was prescribed phentermine.  Briefly discussed Starleen Blue today.  Patient would qualify based on BMI and HTN.  She will do some research on these medications and reach out for an appointment to discuss this further if she would like to go down this path.

## 2021-04-27 LAB — COMPLETE METABOLIC PANEL WITH GFR
AG Ratio: 1.3 (calc) (ref 1.0–2.5)
ALT: 12 U/L (ref 6–29)
AST: 7 U/L — ABNORMAL LOW (ref 10–35)
Albumin: 3.7 g/dL (ref 3.6–5.1)
Alkaline phosphatase (APISO): 92 U/L (ref 37–153)
BUN: 13 mg/dL (ref 7–25)
CO2: 26 mmol/L (ref 20–32)
Calcium: 9 mg/dL (ref 8.6–10.4)
Chloride: 103 mmol/L (ref 98–110)
Creat: 0.79 mg/dL (ref 0.50–1.03)
Globulin: 2.9 g/dL (calc) (ref 1.9–3.7)
Glucose, Bld: 76 mg/dL (ref 65–99)
Potassium: 3.6 mmol/L (ref 3.5–5.3)
Sodium: 138 mmol/L (ref 135–146)
Total Bilirubin: 0.3 mg/dL (ref 0.2–1.2)
Total Protein: 6.6 g/dL (ref 6.1–8.1)
eGFR: 91 mL/min/{1.73_m2} (ref >=60)

## 2021-04-27 LAB — CBC WITH DIFFERENTIAL/PLATELET
Absolute Monocytes: 524 cells/uL (ref 200–950)
Basophils Absolute: 34 cells/uL (ref 0–200)
Basophils Relative: 0.3 %
Eosinophils Absolute: 148 cells/uL (ref 15–500)
Eosinophils Relative: 1.3 %
HCT: 34.5 % — ABNORMAL LOW (ref 35.0–45.0)
Hemoglobin: 11.3 g/dL — ABNORMAL LOW (ref 11.7–15.5)
Lymphs Abs: 1767 cells/uL (ref 850–3900)
MCH: 23 pg — ABNORMAL LOW (ref 27.0–33.0)
MCHC: 32.8 g/dL (ref 32.0–36.0)
MCV: 70.3 fL — ABNORMAL LOW (ref 80.0–100.0)
MPV: 10.1 fL (ref 7.5–12.5)
Monocytes Relative: 4.6 %
Neutro Abs: 8926 cells/uL — ABNORMAL HIGH (ref 1500–7800)
Neutrophils Relative %: 78.3 %
Platelets: 354 10*3/uL (ref 140–400)
RBC: 4.91 10*6/uL (ref 3.80–5.10)
RDW: 15.3 % — ABNORMAL HIGH (ref 11.0–15.0)
Total Lymphocyte: 15.5 %
WBC: 11.4 10*3/uL — ABNORMAL HIGH (ref 3.8–10.8)

## 2021-04-27 LAB — IRON,TIBC AND FERRITIN PANEL
%SAT: 9 % (calc) — ABNORMAL LOW (ref 16–45)
Ferritin: 28 ng/mL (ref 16–232)
Iron: 34 ug/dL — ABNORMAL LOW (ref 45–160)
TIBC: 383 mcg/dL (calc) (ref 250–450)

## 2021-04-27 LAB — LIPID PANEL
Cholesterol: 186 mg/dL (ref <200)
HDL: 45 mg/dL — ABNORMAL LOW (ref >=50)
LDL Cholesterol (Calc): 116 mg/dL (calc) — ABNORMAL HIGH
Non-HDL Cholesterol (Calc): 141 mg/dL (calc) — ABNORMAL HIGH (ref <130)
Total CHOL/HDL Ratio: 4.1 (calc) (ref <5.0)
Triglycerides: 137 mg/dL (ref <150)

## 2021-04-27 LAB — HEMOGLOBIN A1C
Hgb A1c MFr Bld: 5.6 % of total Hgb (ref <5.7)
Mean Plasma Glucose: 114 mg/dL
eAG (mmol/L): 6.3 mmol/L

## 2021-04-30 LAB — PAP, TP IMAGING W/ HPV RNA, RFLX HPV TYPE 16,18/45: HPV DNA High Risk: NOT DETECTED

## 2021-05-17 ENCOUNTER — Telehealth: Payer: Self-pay | Admitting: Nurse Practitioner

## 2021-05-17 DIAGNOSIS — R21 Rash and other nonspecific skin eruption: Secondary | ICD-10-CM

## 2021-05-17 MED ORDER — HYDROCHLOROTHIAZIDE 25 MG PO TABS: 25.0000 mg | ORAL_TABLET | Freq: Every day | ORAL | 2 refills | Status: DC

## 2021-05-17 MED ORDER — CLOBETASOL PROPIONATE 0.05 % EX OINT: 1.0000 "application " | TOPICAL_OINTMENT | Freq: Two times a day (BID) | CUTANEOUS | 0 refills | Status: DC

## 2021-05-17 NOTE — Telephone Encounter (Signed)
Refills sent for clobetasol and HCTZ.  Pt asking for doxycycline for acne, states previous rx from Dr. Buelah Manis. Last refill 2017. Please advise, thanks!

## 2021-05-17 NOTE — Telephone Encounter (Signed)
ATC, vm not set up.

## 2021-05-17 NOTE — Telephone Encounter (Signed)
Patient called to follow up on refills requested for   clobetasol ointment (TEMOVATE) 0.05 % [[id number]]  hydrochlorothiazide (HYDRODIURIL) 25 MG tablet [[id number]]   Also requesting refill of dioxycycline 100MG  once a day    States pharmacy never received refill requests.  Pharmacy confirmed as   [place], [place] - [address]  [address], [place] [address]  Phone:  [phone]  Fax:  [phone]   Please advise [phone]

## 2021-05-17 NOTE — Telephone Encounter (Signed)
Please schedule appointment.

## 2021-05-22 NOTE — Telephone Encounter (Signed)
Spoke with pt and advised OV needed for refills on doxycycline. Pt voiced understanding, wcb to schedule. Nothing further needed.

## 2021-07-02 ENCOUNTER — Other Ambulatory Visit: Payer: Self-pay | Admitting: Nurse Practitioner

## 2021-07-17 ENCOUNTER — Ambulatory Visit: Payer: Self-pay | Admitting: Allergy & Immunology

## 2021-08-15 ENCOUNTER — Other Ambulatory Visit: Payer: Self-pay | Admitting: Nurse Practitioner

## 2021-08-15 DIAGNOSIS — R21 Rash and other nonspecific skin eruption: Secondary | ICD-10-CM

## 2022-06-08 ENCOUNTER — Other Ambulatory Visit: Payer: Self-pay | Admitting: Nurse Practitioner

## 2022-08-13 ENCOUNTER — Other Ambulatory Visit: Payer: Self-pay

## 2022-08-13 DIAGNOSIS — I10 Essential (primary) hypertension: Secondary | ICD-10-CM

## 2022-08-13 MED ORDER — HYDROCHLOROTHIAZIDE 25 MG PO TABS
25.0000 mg | ORAL_TABLET | Freq: Every day | ORAL | 2 refills | Status: DC
Start: 2022-08-13 — End: 2023-01-15

## 2022-08-28 DIAGNOSIS — Z713 Dietary counseling and surveillance: Secondary | ICD-10-CM | POA: Diagnosis not present

## 2022-08-28 DIAGNOSIS — Z7182 Exercise counseling: Secondary | ICD-10-CM | POA: Diagnosis not present

## 2022-08-28 DIAGNOSIS — Z79899 Other long term (current) drug therapy: Secondary | ICD-10-CM | POA: Diagnosis not present

## 2022-08-28 DIAGNOSIS — Z6841 Body Mass Index (BMI) 40.0 and over, adult: Secondary | ICD-10-CM | POA: Diagnosis not present

## 2022-10-09 ENCOUNTER — Other Ambulatory Visit: Payer: Self-pay | Admitting: Family Medicine

## 2022-10-09 ENCOUNTER — Other Ambulatory Visit: Payer: Self-pay | Admitting: Nurse Practitioner

## 2022-10-22 DIAGNOSIS — Z7182 Exercise counseling: Secondary | ICD-10-CM | POA: Diagnosis not present

## 2022-10-22 DIAGNOSIS — Z6841 Body Mass Index (BMI) 40.0 and over, adult: Secondary | ICD-10-CM | POA: Diagnosis not present

## 2022-10-22 DIAGNOSIS — L209 Atopic dermatitis, unspecified: Secondary | ICD-10-CM | POA: Diagnosis not present

## 2022-10-22 DIAGNOSIS — Z713 Dietary counseling and surveillance: Secondary | ICD-10-CM | POA: Diagnosis not present

## 2022-10-28 ENCOUNTER — Ambulatory Visit: Payer: 59 | Admitting: Family Medicine

## 2022-12-02 ENCOUNTER — Ambulatory Visit: Payer: 59 | Admitting: Family Medicine

## 2023-01-03 ENCOUNTER — Other Ambulatory Visit: Payer: Self-pay | Admitting: Family Medicine

## 2023-01-03 DIAGNOSIS — I10 Essential (primary) hypertension: Secondary | ICD-10-CM

## 2023-01-05 NOTE — Telephone Encounter (Signed)
Requested medications are due for refill today.  yes  Requested medications are on the active medications list.  yes  Last refill. 08/13/2022 #30 2 rf  Future visit scheduled.   no  Notes to clinic.  Pt last seen 03/2021. No pcp listed.    Requested Prescriptions  Pending Prescriptions Disp Refills   hydrochlorothiazide (HYDRODIURIL) 25 MG tablet [Pharmacy Med Name: hydrochlorothiazide 25 mg tablet] 30 tablet 0    Sig: TAKE ONE TABLET BY MOUTH ONCE DAILY     Cardiovascular: Diuretics - Thiazide Failed - 01/03/2023 11:10 AM      Failed - Cr in normal range and within 180 days    Creat  Date Value Ref Range Status  04/26/2021 0.79 0.50 - 1.03 mg/dL Final   Creatinine, Urine  Date Value Ref Range Status  10/26/2015 257 20 - 320 mg/dL Final         Failed - K in normal range and within 180 days    Potassium  Date Value Ref Range Status  04/26/2021 3.6 3.5 - 5.3 mmol/L Final         Failed - Na in normal range and within 180 days    Sodium  Date Value Ref Range Status  04/26/2021 138 135 - 146 mmol/L Final  07/15/2012 138 137 - 147 mmol/L Final         Failed - Valid encounter within last 6 months    Recent Outpatient Visits           1 year ago Annual physical exam   [facility] Valentino Nose, NP   1 year ago Rash and nonspecific skin eruption   [facility] Valentino Nose, NP   2 years ago Encounter for pregnancy test, result unknown   [facility] [place], Velna Hatchet, MD   3 years ago Closed fracture of sacrum and coccyx with routine healing, subsequent encounter   [facility] [place], Velna Hatchet, MD   3 years ago Hypokalemia   [facility] [place], Velna Hatchet, MD              Passed - Last BP in normal range    BP Readings from Last 1 Encounters:  04/26/21 138/82

## 2023-01-15 ENCOUNTER — Encounter (HOSPITAL_COMMUNITY): Payer: Self-pay | Admitting: Emergency Medicine

## 2023-01-15 ENCOUNTER — Emergency Department (HOSPITAL_COMMUNITY)
Admission: EM | Admit: 2023-01-15 | Discharge: 2023-01-15 | Disposition: A | Discharge status: Home / Self Care | Payer: 59 | Attending: Emergency Medicine | Admitting: Emergency Medicine

## 2023-01-15 DIAGNOSIS — Z91040 Latex allergy status: Secondary | ICD-10-CM | POA: Insufficient documentation

## 2023-01-15 DIAGNOSIS — I10 Essential (primary) hypertension: Secondary | ICD-10-CM | POA: Insufficient documentation

## 2023-01-15 DIAGNOSIS — Z760 Encounter for issue of repeat prescription: Secondary | ICD-10-CM | POA: Insufficient documentation

## 2023-01-15 DIAGNOSIS — Z79899 Other long term (current) drug therapy: Secondary | ICD-10-CM | POA: Insufficient documentation

## 2023-01-15 MED ORDER — HYDROCHLOROTHIAZIDE 25 MG PO TABS: 25.0000 mg | ORAL_TABLET | Freq: Every day | ORAL | 1 refills | Status: DC

## 2023-01-15 NOTE — ED Triage Notes (Signed)
Pt ran out of HTN meds due to PCP leaving. Appt in August. Pt takes hydrochlorothiazide, out for 2 weeks.

## 2023-01-15 NOTE — ED Provider Notes (Signed)
[place] EMERGENCY DEPARTMENT AT [facility] Provider Note   CSN: [id number] Arrival date & time: 01/15/23  1312     History  Chief Complaint  Patient presents with   Hypertension   Medication Refill    Elizabeth Harper is a 52 y.o. female who presents today with concern for running out of her hydrochlorothiazide 25 mg tablets.  She has been out of this medication for 2 weeks.  She is switching primary care providers and does not have an appointment until August 12.  Her blood pressures have been running high at home and she requests a refill.  She reports that she has been on this medication since 2014 without any problems.  Denies any chest pain, shortness of breath, headaches, dizziness, vision changes, any other symptoms.   Hypertension  Medication Refill      Home Medications Prior to Admission medications   Medication Sig Start Date End Date Taking? Authorizing Provider  cholecalciferol (VITAMIN D) 1000 units tablet TAKE 1 TABLET BY MOUTH ONCE DAILY. Patient not taking: No sig reported 01/23/17   Salley Scarlet, MD  clobetasol ointment (TEMOVATE) 0.05 % APPLY TO AFFECTED AREA TWICE DAILY 08/15/21   Donita Brooks, MD  famotidine (PEPCID) 20 MG tablet Take 1 tablet (20 mg total) by mouth 2 (two) times daily. 04/09/21   Valentino Nose, NP  FEROSUL 325 (65 Fe) MG tablet TAKE (1) TABLET BY MOUTH TWICE A DAY WITH MEALS (BREAKFAST AND SUPPER) 08/27/20   [place], Velna Hatchet, MD  hydrochlorothiazide (HYDRODIURIL) 25 MG tablet Take 1 tablet (25 mg total) by mouth daily. 01/15/23 02/14/23  Arabella Merles, PA-C  potassium chloride SA (KLOR-CON M) 20 MEQ tablet TAKE 1 TABLET BY MOUTH ONCE DAILY. 07/02/21   Donita Brooks, MD  valACYclovir (VALTREX) 1000 MG tablet Take 1 tablet (1,000 mg total) by mouth 2 (two) times daily. 04/23/21   Valentino Nose, NP      Allergies    Iodinated contrast media, Gadolinium derivatives, and Latex    Review of Systems    Review of Systems  All other systems reviewed and are negative.   Physical Exam Updated Vital Signs BP (!) 179/91 (BP Location: Left Arm)   Pulse 89   Temp 97.6 F (36.4 C) (Oral)   Resp 18   Ht 5\' 6"  (1.676 m)   Wt 131 kg   SpO2 100%   BMI 46.61 kg/m  Physical Exam Vitals and nursing note reviewed.  Constitutional:      Appearance: Normal appearance.  HENT:     Head: Atraumatic.  Cardiovascular:     Rate and Rhythm: Normal rate and regular rhythm.  Pulmonary:     Effort: Pulmonary effort is normal.  Musculoskeletal:        General: Normal range of motion.  Skin:    General: Skin is warm and dry.  Neurological:     General: No focal deficit present.     Mental Status: She is alert.  Psychiatric:        Mood and Affect: Mood normal.        Behavior: Behavior normal.     ED Results / Procedures / Treatments   Labs (all labs ordered are listed, but only abnormal results are displayed) Labs Reviewed - No data to display  EKG None  Radiology No results found.  Procedures Procedures    Medications Ordered in ED Medications - No data to display  ED Course/ Medical Decision Making/  A&P                             Medical Decision Making  52 y.o. female with pertinent past medical history of hypertension presents to the ED for concern of refill of hydrochlorothiazide  Differential diagnosis includes but is not limited to hypertension, hypertensive urgency, hypertensive emergency  ED Course:  Patient states she has not been taking her hydrochlorothiazide for the past 2 weeks since she ran out.  Her blood pressure today is 179/91, she denies any symptoms.  She likely is experiencing hypertension secondary to running on her medications.  Reports that on the HCTZ her normal blood pressures in the 120s over 180s.  She has been on this medication since 2014 without any problems.  Will give her a refill of this medication to use until she gets into her primary  care appointment on August 12.  Impression: Hypertension  Disposition:  The patient was discharged home with instructions to take her hydrochlorothiazide as prescribed.  She understands she needs to follow-up with her primary care provider for further refills Return precautions given.     Co morbidities that complicate the patient evaluation  HTN              Final Clinical Impression(s) / ED Diagnoses Final diagnoses:  Primary hypertension  Medication refill    Rx / DC Orders ED Discharge Orders          Ordered    hydrochlorothiazide (HYDRODIURIL) 25 MG tablet  Daily        01/15/23 1345              Arabella Merles, PA-C 01/15/23 1421    Lonell Grandchild, MD 01/17/23 [phone]

## 2023-01-15 NOTE — Discharge Instructions (Signed)
You have been prescribed a refill of your hydrochlorothiazide today.  Please take 1 tablet every day for your blood pressure control.   As discussed, please attend your primary care appointment on August 12th to discuss blood pressure management and to get further refills.  Return to the ER if your blood pressures become over 185/120, you get severe headaches, dizziness, chest pain, shortness of breath, or any other new or concerning symptoms.

## 2023-02-09 ENCOUNTER — Ambulatory Visit: Payer: 59 | Admitting: Family Medicine

## 2023-04-11 ENCOUNTER — Other Ambulatory Visit: Payer: Self-pay | Admitting: Family Medicine

## 2023-04-11 DIAGNOSIS — I10 Essential (primary) hypertension: Secondary | ICD-10-CM

## 2023-04-13 NOTE — Telephone Encounter (Signed)
Requested medication (s) are due for refill today: routing for review  Requested medication (s) are on the active medication list: yes  Last refill:  01/15/23  Future visit scheduled: no  Notes to clinic:  Unable to refill per protocol due to failed labs, no updated results. OV needed     Requested Prescriptions  Pending Prescriptions Disp Refills   hydrochlorothiazide (HYDRODIURIL) 25 MG tablet [Pharmacy Med Name: hydrochlorothiazide 25 mg tablet] 3 tablet 0    Sig: TAKE ONE TABLET BY MOUTH ONCE DAILY     Cardiovascular: Diuretics - Thiazide Failed - 04/11/2023 12:47 PM      Failed - Cr in normal range and within 180 days    Creat  Date Value Ref Range Status  04/26/2021 0.79 0.50 - 1.03 mg/dL Final   Creatinine, Urine  Date Value Ref Range Status  10/26/2015 257 20 - 320 mg/dL Final         Failed - K in normal range and within 180 days    Potassium  Date Value Ref Range Status  04/26/2021 3.6 3.5 - 5.3 mmol/L Final         Failed - Na in normal range and within 180 days    Sodium  Date Value Ref Range Status  04/26/2021 138 135 - 146 mmol/L Final  07/15/2012 138 137 - 147 mmol/L Final         Failed - Last BP in normal range    BP Readings from Last 1 Encounters:  01/15/23 (!) 179/91         Failed - Valid encounter within last 6 months    Recent Outpatient Visits           1 year ago Annual physical exam   [facility] Valentino Nose, NP   2 years ago Rash and nonspecific skin eruption   [facility] Valentino Nose, NP   3 years ago Encounter for pregnancy test, result unknown   [facility] [place], Velna Hatchet, MD   3 years ago Closed fracture of sacrum and coccyx with routine healing, subsequent encounter   [facility] [place], Velna Hatchet, MD   3 years ago Hypokalemia   [facility] [place], Velna Hatchet, MD

## 2023-08-07 ENCOUNTER — Other Ambulatory Visit: Payer: Self-pay

## 2023-08-07 ENCOUNTER — Encounter (HOSPITAL_COMMUNITY): Payer: Self-pay

## 2023-08-07 ENCOUNTER — Emergency Department (HOSPITAL_COMMUNITY)
Admission: EM | Admit: 2023-08-07 | Discharge: 2023-08-07 | Disposition: A | Discharge status: Home / Self Care | Payer: Self-pay | Attending: Emergency Medicine | Admitting: Emergency Medicine

## 2023-08-07 DIAGNOSIS — Z79899 Other long term (current) drug therapy: Secondary | ICD-10-CM | POA: Insufficient documentation

## 2023-08-07 DIAGNOSIS — I10 Essential (primary) hypertension: Secondary | ICD-10-CM | POA: Insufficient documentation

## 2023-08-07 DIAGNOSIS — R519 Headache, unspecified: Secondary | ICD-10-CM

## 2023-08-07 DIAGNOSIS — Z91040 Latex allergy status: Secondary | ICD-10-CM | POA: Insufficient documentation

## 2023-08-07 LAB — BASIC METABOLIC PANEL
Anion gap: 8 (ref 5–15)
BUN: 10 mg/dL (ref 6–20)
CO2: 26 mmol/L (ref 22–32)
Calcium: 9.5 mg/dL (ref 8.9–10.3)
Chloride: 104 mmol/L (ref 98–111)
Creatinine, Ser: 0.7 mg/dL (ref 0.44–1.00)
GFR, Estimated: >60 mL/min (ref >60)
Glucose, Bld: 91 mg/dL (ref 70–99)
Potassium: 3.5 mmol/L (ref 3.5–5.1)
Sodium: 138 mmol/L (ref 135–145)

## 2023-08-07 LAB — MAGNESIUM: Magnesium: 2.2 mg/dL (ref 1.7–2.4)

## 2023-08-07 MED ORDER — POTASSIUM CHLORIDE CRYS ER 20 MEQ PO TBCR: 20.0000 meq | EXTENDED_RELEASE_TABLET | Freq: Every day | ORAL | 2 refills | Status: AC

## 2023-08-07 MED ORDER — HYDROCHLOROTHIAZIDE 25 MG PO TABS: 25.0000 mg | ORAL_TABLET | Freq: Every day | ORAL | 2 refills | Status: DC

## 2023-08-07 MED ORDER — HYDROCHLOROTHIAZIDE 12.5 MG PO CAPS
25.0000 mg | ORAL_CAPSULE | Freq: Once | ORAL | Status: DC
Start: 2023-08-07 — End: 2023-08-07

## 2023-08-07 MED ORDER — HYDROCHLOROTHIAZIDE 25 MG PO TABS
25.0000 mg | ORAL_TABLET | Freq: Once | ORAL | Status: AC
  Administered 2023-08-07: 25 mg via ORAL
  Filled 2023-08-07: qty 1

## 2023-08-07 NOTE — Discharge Instructions (Signed)
 You were seen for your elevated blood pressure in the emergency department.   At home, please take the hydrochlorothiazide  you are prescribed.    Check your MyChart online for the results of any tests that had not resulted by the time you left the emergency department.   Follow-up with your primary doctor in 2-3 days regarding your visit.  If you do not have a primary care doctor you may follow-up with [facility] which is listed in this packet.  Follow-up with ophthalmology about your vision.   Return immediately to the emergency department if you experience any of the following: severe headache, numbness or weakness of your arms or legs, worsening vision, or any other concerning symptoms.    Thank you for visiting our Emergency Department. It was a pleasure taking care of you today.

## 2023-08-07 NOTE — ED Provider Notes (Signed)
 [place] EMERGENCY DEPARTMENT AT [facility] Provider Note   CSN: [id number] Arrival date & time: 08/07/23  1225     History  Chief Complaint  Patient presents with   Headache    Elizabeth Harper is a 53 y.o. female.  53 year-old female who presents emergency department for hypertension.  Patient reports that 3 weeks ago she ran out of her blood pressure medication.  Reports that recently SBP has been around 150s. Typically in the 120s systolic when she has been taking hydrochlorothiazide . Also having a mild headache. 2/10 in severity.  Occipital pressure.  Also reports some possible blurry vision but has not been wearing glasses that she is prescribed.       Home Medications Prior to Admission medications   Medication Sig Start Date End Date Taking? Authorizing Provider  cholecalciferol (VITAMIN D ) 1000 units tablet TAKE 1 TABLET BY MOUTH ONCE DAILY. Patient not taking: No sig reported 01/23/17   Bari Theodoro FALCON, MD  clobetasol  ointment (TEMOVATE ) 0.05 % APPLY TO AFFECTED AREA TWICE DAILY 08/15/21   Duanne Butler DASEN, MD  famotidine  (PEPCID ) 20 MG tablet Take 1 tablet (20 mg total) by mouth 2 (two) times daily. 04/09/21   Chandra Harlene LABOR, NP  FEROSUL 325 (65 Fe) MG tablet TAKE (1) TABLET BY MOUTH TWICE A DAY WITH MEALS (BREAKFAST AND SUPPER) 08/27/20   Bari Theodoro FALCON, MD  hydrochlorothiazide  (HYDRODIURIL ) 25 MG tablet Take 1 tablet (25 mg total) by mouth daily. 08/07/23 09/06/23  Yolande Lamar BROCKS, MD  potassium chloride  SA (KLOR-CON  M) 20 MEQ tablet Take 1 tablet (20 mEq total) by mouth daily. 08/07/23   Yolande Lamar BROCKS, MD  valACYclovir  (VALTREX ) 1000 MG tablet Take 1 tablet (1,000 mg total) by mouth 2 (two) times daily. 04/23/21   Chandra Harlene LABOR, NP      Allergies    Iodinated contrast media, Gadolinium derivatives, and Latex    Review of Systems   Review of Systems  Physical Exam Updated Vital Signs BP (!) 152/93   Pulse 75   Temp 98.1 F (36.7  C) (Oral)   Resp 19   Ht 5' 6 (1.676 m)   Wt 124.7 kg   SpO2 98%   BMI 44.39 kg/m  Physical Exam Vitals and nursing note reviewed.  Constitutional:      General: She is not in acute distress.    Appearance: She is well-developed.  HENT:     Head: Normocephalic and atraumatic.     Right Ear: External ear normal.     Left Ear: External ear normal.     Nose: Nose normal.  Eyes:     Extraocular Movements: Extraocular movements intact.     Conjunctiva/sclera: Conjunctivae normal.     Pupils: Pupils are equal, round, and reactive to light.     Comments: Best uncorrected vision 20/50 out of both eyes  Cardiovascular:     Rate and Rhythm: Normal rate and regular rhythm.     Heart sounds: No murmur heard. Pulmonary:     Effort: Pulmonary effort is normal. No respiratory distress.     Breath sounds: Normal breath sounds.  Abdominal:     General: Abdomen is flat.  Musculoskeletal:     Cervical back: Normal range of motion and neck supple.     Right lower leg: No edema.     Left lower leg: No edema.  Skin:    General: Skin is warm and dry.  Neurological:  Mental Status: She is alert.     Comments: MENTAL STATUS: AAOx3 CRANIAL NERVES: II: Pupils equal and reactive 4 mm BL, no RAPD, no VF deficits III, IV, VI: EOM intact, no gaze preference or deviation, no nystagmus. V: normal sensation to light touch in V1, V2, and V3 segments bilaterally VII: no facial weakness or asymmetry, no nasolabial fold flattening VIII: normal hearing to speech and finger friction IX, X: normal palatal elevation, no uvular deviation XI: 5/5 head turn and 5/5 shoulder shrug bilaterally XII: midline tongue protrusion MOTOR: 5/5 strength in R shoulder flexion, elbow flexion and extension, and grip strength. 5/5 strength in L shoulder flexion, elbow flexion and extension, and grip strength.  5/5 strength in R hip and knee flexion, knee extension, ankle plantar and dorsiflexion. 5/5 strength in L hip and  knee flexion, knee extension, ankle plantar and dorsiflexion. SENSORY: Normal sensation to light touch in all extremities COORD: Normal finger to nose and heel to shin, no tremor, no dysmetria  Psychiatric:        Mood and Affect: Mood normal.     ED Results / Procedures / Treatments   Labs (all labs ordered are listed, but only abnormal results are displayed) Labs Reviewed  BASIC METABOLIC PANEL  MAGNESIUM    EKG EKG Interpretation Date/Time:  Friday August 07 2023 14:19:39 EST Ventricular Rate:  79 PR Interval:  161 QRS Duration:  91 QT Interval:  382 QTC Calculation: 438 R Axis:   40  Text Interpretation: Sinus rhythm Confirmed by Yolande Charleston [phone]) on 08/07/2023 3:40:00 PM  Radiology No results found.  Procedures Procedures    Medications Ordered in ED Medications  hydrochlorothiazide  (HYDRODIURIL ) tablet 25 mg (25 mg Oral Given 08/07/23 1542)    ED Course/ Medical Decision Making/ A&P                                 Medical Decision Making Amount and/or Complexity of Data Reviewed Labs: ordered.  Risk Prescription drug management.   Elizabeth Harper is a 53 y.o. female with comorbidities that complicate the patient evaluation including hypertension who presents to the emergency department with headache and elevated blood pressure  Initial Ddx:  Hypertensive emergency, hypertensive urgency, migraine, ICH, PRES  MDM/Course:  Patient presents emergency department with elevated blood pressure and mild headache.  Her blood pressure is in the 150s systolic.  This is out of range of typical hypertensive emergency so feel that this is less likely.  She has a nonfocal neurologic exam at this time so feel that ICH is unlikely.  Do not feel that cross-sectional imaging would be of much utility at this time.  She reports that she has been out of her hydrochlorothiazide  so we did check labs which were unremarkable.  Given a prescription of her hydrochlorothiazide .   Instructed to follow-up with her primary doctor and ophthalmology.    This patient presents to the ED for concern of complaints listed in HPI, this involves an extensive number of treatment options, and is a complaint that carries with it a high risk of complications and morbidity. Disposition including potential need for admission considered.   Dispo: DC Home. Return precautions discussed including, but not limited to, those listed in the AVS. Allowed pt time to ask questions which were answered fully prior to dc.  Records reviewed Outpatient Clinic Notes The following labs were independently interpreted: Chemistry and show no acute abnormality I  have reviewed the patients home medications and made adjustments as needed  Portions of this note were generated with Scientist, clinical (histocompatibility and immunogenetics). Dictation errors may occur despite best attempts at proofreading.     Final Clinical Impression(s) / ED Diagnoses Final diagnoses:  Uncontrolled hypertension  Nonintractable headache, unspecified chronicity pattern, unspecified headache type    Rx / DC Orders ED Discharge Orders          Ordered    hydrochlorothiazide  (HYDRODIURIL ) 25 MG tablet  Daily        08/07/23 1541    potassium chloride  SA (KLOR-CON  M) 20 MEQ tablet  Daily        08/07/23 1541              Yolande Lamar BROCKS, MD 08/08/23 [phone]

## 2023-08-07 NOTE — ED Triage Notes (Signed)
 Patient said she has been out of her blood pressure medication for 3 weeks. Wants to find a new doctor. Complaining of headache and blurred vision.

## 2024-01-08 ENCOUNTER — Emergency Department (HOSPITAL_COMMUNITY)
Admission: EM | Admit: 2024-01-08 | Discharge: 2024-01-08 | Disposition: A | Discharge status: Home / Self Care | Payer: Self-pay | Attending: Emergency Medicine | Admitting: Emergency Medicine

## 2024-01-08 ENCOUNTER — Encounter (HOSPITAL_COMMUNITY): Payer: Self-pay | Admitting: Emergency Medicine

## 2024-01-08 ENCOUNTER — Emergency Department (HOSPITAL_COMMUNITY): Payer: Self-pay

## 2024-01-08 DIAGNOSIS — R350 Frequency of micturition: Secondary | ICD-10-CM | POA: Insufficient documentation

## 2024-01-08 DIAGNOSIS — R519 Headache, unspecified: Secondary | ICD-10-CM

## 2024-01-08 DIAGNOSIS — Z91040 Latex allergy status: Secondary | ICD-10-CM | POA: Insufficient documentation

## 2024-01-08 DIAGNOSIS — I10 Essential (primary) hypertension: Secondary | ICD-10-CM | POA: Insufficient documentation

## 2024-01-08 LAB — CBC WITH DIFFERENTIAL/PLATELET
Abs Immature Granulocytes: 0.01 K/uL (ref 0.00–0.07)
Basophils Absolute: 0 K/uL (ref 0.0–0.1)
Basophils Relative: 0 %
Eosinophils Absolute: 0.2 K/uL (ref 0.0–0.5)
Eosinophils Relative: 3 %
HCT: 37.5 % (ref 36.0–46.0)
Hemoglobin: 11.4 g/dL — ABNORMAL LOW (ref 12.0–15.0)
Immature Granulocytes: 0 %
Lymphocytes Relative: 31 %
Lymphs Abs: 2.1 K/uL (ref 0.7–4.0)
MCH: 23.2 pg — ABNORMAL LOW (ref 26.0–34.0)
MCHC: 30.4 g/dL (ref 30.0–36.0)
MCV: 76.2 fL — ABNORMAL LOW (ref 80.0–100.0)
Monocytes Absolute: 0.3 K/uL (ref 0.1–1.0)
Monocytes Relative: 4 %
Neutro Abs: 4.1 K/uL (ref 1.7–7.7)
Neutrophils Relative %: 62 %
Platelets: 300 K/uL (ref 150–400)
RBC: 4.92 MIL/uL (ref 3.87–5.11)
RDW: 15.1 % (ref 11.5–15.5)
WBC: 6.7 K/uL (ref 4.0–10.5)
nRBC: 0 % (ref 0.0–0.2)

## 2024-01-08 LAB — URINALYSIS, W/ REFLEX TO CULTURE (INFECTION SUSPECTED)
Bilirubin Urine: NEGATIVE
Glucose, UA: NEGATIVE mg/dL
Hgb urine dipstick: NEGATIVE
Ketones, ur: NEGATIVE mg/dL
Leukocytes,Ua: NEGATIVE
Nitrite: NEGATIVE
Protein, ur: NEGATIVE mg/dL
Specific Gravity, Urine: 1.02 (ref 1.005–1.030)
pH: 6 (ref 5.0–8.0)

## 2024-01-08 LAB — BASIC METABOLIC PANEL WITH GFR
Anion gap: 8 (ref 5–15)
BUN: 10 mg/dL (ref 6–20)
CO2: 25 mmol/L (ref 22–32)
Calcium: 9 mg/dL (ref 8.9–10.3)
Chloride: 104 mmol/L (ref 98–111)
Creatinine, Ser: 0.69 mg/dL (ref 0.44–1.00)
GFR, Estimated: >60 mL/min (ref >60)
Glucose, Bld: 93 mg/dL (ref 70–99)
Potassium: 3.9 mmol/L (ref 3.5–5.1)
Sodium: 137 mmol/L (ref 135–145)

## 2024-01-08 LAB — HCG, SERUM, QUALITATIVE: Preg, Serum: NEGATIVE

## 2024-01-08 MED ORDER — SODIUM CHLORIDE 0.9 % IV BOLUS
1000.0000 mL | Freq: Once | INTRAVENOUS | Status: AC
  Administered 2024-01-08: 1000 mL via INTRAVENOUS

## 2024-01-08 MED ORDER — PROCHLORPERAZINE EDISYLATE 10 MG/2ML IJ SOLN
10.0000 mg | Freq: Once | INTRAMUSCULAR | Status: AC
  Administered 2024-01-08: 10 mg via INTRAVENOUS
  Filled 2024-01-08: qty 2

## 2024-01-08 MED ORDER — DIPHENHYDRAMINE HCL 50 MG/ML IJ SOLN
12.5000 mg | Freq: Once | INTRAMUSCULAR | Status: AC
  Administered 2024-01-08: 12.5 mg via INTRAVENOUS
  Filled 2024-01-08: qty 1

## 2024-01-08 MED ORDER — HYDROCHLOROTHIAZIDE 25 MG PO TABS: 25.0000 mg | ORAL_TABLET | Freq: Every day | ORAL | 2 refills | Status: AC

## 2024-01-08 NOTE — Discharge Instructions (Addendum)
 It was a pleasure taking care of you here today  Your labs and imaging were reassuring.  Make sure to keep a close eye on your symptoms.  I refilled your blood pressure medication.  Make sure to establish care with a primary care provider  Return for new or worsening symptoms, such as severe headache, vision changes, numbness, weakness, slurred speech, chest pain, neck pain/stiffness.

## 2024-01-08 NOTE — ED Provider Notes (Signed)
 [place] EMERGENCY DEPARTMENT AT [facility] Provider Note   CSN: [id number] Arrival date & time: 01/08/24  1346    Patient presents with: Headache   Elizabeth Harper is a 53 y.o. female for evaluation of headache and hypertension.  History of hypertension.  Has been out of her HCTZ as she is no longer established with a PCP.  Developed a headache around 4 AM this morning.  Denies any sudden onset thunderclap headache.  Does not typically get headaches.  She felt her blood pressure was elevated.  No blurred vision, neck pain, fever, emesis.  Has noted some urinary frequency without dysuria or hematuria.  No slurred speech, numbness or weakness.  No recent head injury.   HPI     Prior to Admission medications   Medication Sig Start Date End Date Taking? Authorizing Provider  cholecalciferol (VITAMIN D ) 1000 units tablet TAKE 1 TABLET BY MOUTH ONCE DAILY. Patient not taking: No sig reported 01/23/17   Bari Theodoro FALCON, MD  clobetasol  ointment (TEMOVATE ) 0.05 % APPLY TO AFFECTED AREA TWICE DAILY 08/15/21   Duanne Butler DASEN, MD  famotidine  (PEPCID ) 20 MG tablet Take 1 tablet (20 mg total) by mouth 2 (two) times daily. 04/09/21   Chandra Harlene LABOR, NP  FEROSUL 325 (65 Fe) MG tablet TAKE (1) TABLET BY MOUTH TWICE A DAY WITH MEALS (BREAKFAST AND SUPPER) 08/27/20   Bari Theodoro FALCON, MD  hydrochlorothiazide  (HYDRODIURIL ) 25 MG tablet Take 1 tablet (25 mg total) by mouth daily. 01/08/24 02/07/24  Selicia Windom A, PA-C  potassium chloride  SA (KLOR-CON  M) 20 MEQ tablet Take 1 tablet (20 mEq total) by mouth daily. 08/07/23   Yolande Lamar BROCKS, MD  valACYclovir  (VALTREX ) 1000 MG tablet Take 1 tablet (1,000 mg total) by mouth 2 (two) times daily. 04/23/21   Chandra Harlene LABOR, NP    Allergies: Iodinated contrast media, Gadolinium derivatives, and Latex    Review of Systems  Constitutional: Negative.   HENT: Negative.    Respiratory: Negative.    Cardiovascular: Negative.    Gastrointestinal: Negative.   Genitourinary:  Positive for frequency. Negative for dysuria, flank pain and hematuria.  Musculoskeletal: Negative.   Skin: Negative.   Neurological:  Positive for headaches. Negative for syncope, facial asymmetry, speech difficulty, weakness and numbness.  All other systems reviewed and are negative.   Updated Vital Signs BP (!) 143/104   Pulse 91   Temp 97.8 F (36.6 C) (Oral)   Resp 17   SpO2 100%   Physical Exam Vitals and nursing note reviewed.  Constitutional:      General: She is not in acute distress.    Appearance: She is well-developed. She is not ill-appearing, toxic-appearing or diaphoretic.  HENT:     Head: Normocephalic and atraumatic.  Eyes:     Pupils: Pupils are equal, round, and reactive to light.  Cardiovascular:     Rate and Rhythm: Normal rate.  Pulmonary:     Effort: Pulmonary effort is normal. No respiratory distress.  Abdominal:     General: There is no distension.     Palpations: Abdomen is soft. There is no mass.     Tenderness: There is no abdominal tenderness.  Musculoskeletal:        General: No swelling or tenderness. Normal range of motion.     Cervical back: Normal range of motion.  Skin:    General: Skin is warm and dry.     Capillary Refill: Capillary refill takes less than 2  seconds.  Neurological:     General: No focal deficit present.     Mental Status: She is alert.     Cranial Nerves: No cranial nerve deficit or facial asymmetry.     Sensory: No sensory deficit.     Motor: No weakness.     Gait: Gait normal.  Psychiatric:        Mood and Affect: Mood normal.     (all labs ordered are listed, but only abnormal results are displayed) Labs Reviewed  CBC WITH DIFFERENTIAL/PLATELET - Abnormal; Notable for the following components:      Result Value   Hemoglobin 11.4 (*)    MCV 76.2 (*)    MCH 23.2 (*)    All other components within normal limits  URINALYSIS, W/ REFLEX TO CULTURE (INFECTION  SUSPECTED) - Abnormal; Notable for the following components:   Bacteria, UA FEW (*)    All other components within normal limits  BASIC METABOLIC PANEL WITH GFR  HCG, SERUM, QUALITATIVE    EKG: EKG Interpretation Date/Time:  Friday January 08 2024 15:53:22 EDT Ventricular Rate:  75 PR Interval:  125 QRS Duration:  81 QT Interval:  384 QTC Calculation: 429 R Axis:   58  Text Interpretation: Sinus rhythm Confirmed by Cottie Cough [phone]) on 01/08/2024 4:18:00 PM  Radiology: CT Head Wo Contrast Result Date: 01/08/2024 EXAM: CT HEAD WITHOUT 01/08/2024 04:30:03 PM TECHNIQUE: CT of the head was performed without the administration of intravenous contrast. Automated exposure control, iterative reconstruction, and/or weight based adjustment of the mA/kV was utilized to reduce the radiation dose to as low as reasonably achievable. COMPARISON: 09/22/2019 CLINICAL HISTORY: Headache, new onset (Age >= 51y). headache that began around 4am, pt took Tylenol  around 5 with no relief. Pt denies any visual changes or sensitivity to light and sound. FINDINGS: BRAIN AND VENTRICLES: No acute intracranial hemorrhage. No mass effect or midline shift. No extra-axial fluid collection. Gray-white differentiation is maintained. No hydrocephalus. ORBITS: No acute abnormality. SINUSES AND MASTOIDS: Partial opacification of the right ethmoid air cells. SOFT TISSUES AND SKULL: No acute skull fracture. No acute soft tissue abnormality. IMPRESSION: 1. No acute intracranial abnormality. 2. Partial opacification of the right ethmoid air cells. Electronically signed by: Ryan Chess MD 01/08/2024 04:41 PM EDT RP Workstation: [id number]     Procedures   Medications Ordered in the ED  prochlorperazine  (COMPAZINE ) injection 10 mg (10 mg Intravenous Given 01/08/24 1526)  diphenhydrAMINE  (BENADRYL ) injection 12.5 mg (12.5 mg Intravenous Given 01/08/24 1526)  sodium chloride  0.9 % bolus 1,000 mL (0 mLs Intravenous Stopped 01/08/24  4390)    53 year old here for evaluation of headache and hypertension.  History of hypertension no longer on medications due to loss of follow-up with PCP.  Previously on HCTZ.  Does not typically get headaches, no sudden onset thunderclap headache.  She has a nonfocal neuroexam without deficits.  Significantly hypertensive when she came in.  Also notes some urinary frequency without dysuria or hematuria.  Will plan on labs, imaging, UA, migraine cocktail and reassess  Labs and imaging personally viewed and interpreted:  CBC wo leukocytosis, hgb 11.4- similar to baseline Metabolic panel without significant abnormality Pregnancy test negative UA negative for infection CT head without significant abnormality EKG without ischemic changes  Patient reassessed.  Feels improved.  Reassuring workup.  Low suspicion for hypertensive urgency/emergency, SAH, IIH, intracranial bleed, CVA, dissection  I refilled her home blood pressure medication.  I encouraged her to follow back up with the PCP to reestablish  care, return for new or worsening symptoms.  The patient has been appropriately medically screened and/or stabilized in the ED. I have low suspicion for any other emergent medical condition which would require further screening, evaluation or treatment in the ED or require inpatient management.  Patient is hemodynamically stable and in no acute distress.  Patient able to ambulate in department prior to ED.  Evaluation does not show acute pathology that would require ongoing or additional emergent interventions while in the emergency department or further inpatient treatment.  I have discussed the diagnosis with the patient and answered all questions.  Pain is been managed while in the emergency department and patient has no further complaints prior to discharge.  Patient is comfortable with plan discussed in room and is stable for discharge at this time.  I have discussed strict return precautions for  returning to the emergency department.  Patient was encouraged to follow-up with PCP/specialist refer to at discharge.                                        Medical Decision Making Amount and/or Complexity of Data Reviewed External Data Reviewed: labs, radiology, ECG and notes. Labs: ordered. Decision-making details documented in ED Course. Radiology: ordered and independent interpretation performed. Decision-making details documented in ED Course. ECG/medicine tests: ordered and independent interpretation performed. Decision-making details documented in ED Course.  Risk OTC drugs. Prescription drug management. Parenteral controlled substances. Decision regarding hospitalization. Diagnosis or treatment significantly limited by social determinants of health.       Final diagnoses:  Hypertension, unspecified type  Acute nonintractable headache, unspecified headache type    ED Discharge Orders          Ordered    hydrochlorothiazide  (HYDRODIURIL ) 25 MG tablet  Daily        01/08/24 1737               Nerida Boivin A, PA-C 01/08/24 1805    Cottie Donnice PARAS, MD 01/08/24 2056

## 2024-01-08 NOTE — ED Triage Notes (Signed)
 Pt arriving POV for headache that began around 4am, pt took Tylenol  around 5 with no relief. Pt denies any visual changes or sensitivity to light and sound. Denies N/V. A&O x4
